# Patient Record
Sex: Male | Born: 1975
Health system: Southern US, Community
[De-identification: ages and names within clinical notes are randomized; demographics above are authoritative.]

## PROBLEM LIST (undated history)

## (undated) DIAGNOSIS — M23205 Derangement of unspecified medial meniscus due to old tear or injury, unspecified knee: Secondary | ICD-10-CM

## (undated) DIAGNOSIS — R0789 Other chest pain: Secondary | ICD-10-CM

## (undated) DIAGNOSIS — R079 Chest pain, unspecified: Secondary | ICD-10-CM

## (undated) DIAGNOSIS — Z9889 Other specified postprocedural states: Secondary | ICD-10-CM

## (undated) DIAGNOSIS — Q245 Malformation of coronary vessels: Secondary | ICD-10-CM

## (undated) DIAGNOSIS — R739 Hyperglycemia, unspecified: Secondary | ICD-10-CM

## (undated) DIAGNOSIS — R072 Precordial pain: Secondary | ICD-10-CM

## (undated) DIAGNOSIS — E876 Hypokalemia: Secondary | ICD-10-CM

## (undated) DIAGNOSIS — I10 Essential (primary) hypertension: Secondary | ICD-10-CM

## (undated) HISTORY — DX: Hypokalemia: E87.6

## (undated) HISTORY — DX: Precordial pain: R07.2

## (undated) HISTORY — DX: Other specified postprocedural states: Z98.890

## (undated) HISTORY — DX: Chest pain, unspecified: R07.9

## (undated) HISTORY — DX: Derangement of unspecified medial meniscus due to old tear or injury, unspecified knee: M23.205

## (undated) HISTORY — DX: Hyperglycemia, unspecified: R73.9

## (undated) HISTORY — DX: Malformation of coronary vessels: Q24.5

## (undated) HISTORY — DX: Other chest pain: R07.89

## (undated) HISTORY — PX: OTHER SURGICAL HISTORY: SHX169

---

## 2001-08-27 ENCOUNTER — Ambulatory Visit (HOSPITAL_COMMUNITY): Admission: RE | Admit: 2001-08-27 | Discharge: 2001-08-27 | Payer: Self-pay | Admitting: Family Medicine

## 2001-08-27 ENCOUNTER — Encounter: Payer: Self-pay | Admitting: Family Medicine

## 2003-10-17 ENCOUNTER — Ambulatory Visit (HOSPITAL_COMMUNITY): Admission: RE | Admit: 2003-10-17 | Discharge: 2003-10-17 | Payer: Self-pay | Admitting: Family Medicine

## 2011-07-22 ENCOUNTER — Emergency Department (HOSPITAL_COMMUNITY)
Admission: EM | Admit: 2011-07-22 | Discharge: 2011-07-23 | Disposition: A | Discharge status: Home / Self Care | Payer: Self-pay | Attending: Emergency Medicine | Admitting: Emergency Medicine

## 2011-07-22 ENCOUNTER — Encounter (HOSPITAL_COMMUNITY): Payer: Self-pay | Admitting: *Deleted

## 2011-07-22 DIAGNOSIS — M79609 Pain in unspecified limb: Secondary | ICD-10-CM | POA: Insufficient documentation

## 2011-07-22 DIAGNOSIS — Y92009 Unspecified place in unspecified non-institutional (private) residence as the place of occurrence of the external cause: Secondary | ICD-10-CM | POA: Insufficient documentation

## 2011-07-22 DIAGNOSIS — Y9383 Activity, rough housing and horseplay: Secondary | ICD-10-CM | POA: Insufficient documentation

## 2011-07-22 DIAGNOSIS — S9030XA Contusion of unspecified foot, initial encounter: Secondary | ICD-10-CM | POA: Insufficient documentation

## 2011-07-22 DIAGNOSIS — F17200 Nicotine dependence, unspecified, uncomplicated: Secondary | ICD-10-CM | POA: Insufficient documentation

## 2011-07-22 DIAGNOSIS — IMO0002 Reserved for concepts with insufficient information to code with codable children: Secondary | ICD-10-CM | POA: Insufficient documentation

## 2011-07-22 NOTE — ED Notes (Signed)
The pt has rt foot pain.  His girlfriend lay on her foot and he now has pain

## 2011-07-23 ENCOUNTER — Emergency Department (HOSPITAL_COMMUNITY): Payer: Self-pay

## 2011-07-23 MED ORDER — HYDROCODONE-ACETAMINOPHEN 5-325 MG PO TABS: 2.0000 | ORAL_TABLET | ORAL | Status: AC | PRN

## 2011-07-23 NOTE — ED Provider Notes (Signed)
History     CSN: [id number]  Arrival date & time 07/22/11  2346   First MD Initiated Contact with Patient 07/23/11 0017      Chief Complaint  Patient presents with  . Foot Injury    (Consider location/radiation/quality/duration/timing/severity/associated sxs/prior treatment) HPI Comments: Patient here after wrestling with his girlfriend and she landed on his right foot - reports pain worse with weight bearing but he is able to walk, denies ankle pain, has abrasion to the dorsum of right foot.    Patient is a 36 y.o. male presenting with foot injury. The history is provided by the patient and the spouse. No language interpreter was used.  Foot Injury  The incident occurred 1 to 2 hours ago. The incident occurred at home. The injury mechanism was compression and a direct blow. The pain is present in the right foot. The quality of the pain is described as aching. The pain is at a severity of 6/10. The pain is moderate. The pain has been constant since onset. Pertinent negatives include no numbness, no inability to bear weight, no loss of motion, no muscle weakness, no loss of sensation and no tingling. He reports no foreign bodies present. The symptoms are aggravated by bearing weight. He has tried nothing for the symptoms. The treatment provided no relief.    History reviewed. No pertinent past medical history.  History reviewed. No pertinent past surgical history.  No family history on file.  History  Substance Use Topics  . Smoking status: Current Everyday Smoker  . Smokeless tobacco: Not on file  . Alcohol Use:       Review of Systems  Constitutional: Negative for fever and chills.  HENT: Negative for neck pain.   Eyes: Negative for pain.  Respiratory: Negative for chest tightness and shortness of breath.   Cardiovascular: Negative for leg swelling.  Gastrointestinal: Negative for abdominal pain.  Genitourinary: Negative for flank pain.  Musculoskeletal: Positive for  myalgias and arthralgias. Negative for back pain and joint swelling.  Skin: Negative for rash.  Neurological: Negative for tingling, numbness and headaches.  Psychiatric/Behavioral: Negative for agitation.  All other systems reviewed and are negative.    Allergies  Review of patient's allergies indicates no known allergies.  Home Medications  No current outpatient prescriptions on file.  BP 121/75  Pulse 66  Temp(Src) 98.3 F (36.8 C) (Oral)  Resp 16  SpO2 97%  Physical Exam  Nursing note and vitals reviewed. Constitutional: He is oriented to person, place, and time. He appears well-developed and well-nourished. No distress.  HENT:  Head: Normocephalic and atraumatic.  Right Ear: External ear normal.  Left Ear: External ear normal.  Nose: Nose normal.  Mouth/Throat: Oropharynx is clear and moist. No oropharyngeal exudate.  Eyes: Conjunctivae are normal. Pupils are equal, round, and reactive to light. No scleral icterus.  Neck: Normal range of motion. Neck supple.  Cardiovascular: Normal rate, regular rhythm and normal heart sounds.  Exam reveals no gallop and no friction rub.   No murmur heard. Pulmonary/Chest: Effort normal and breath sounds normal. He exhibits no tenderness.  Abdominal: Soft. Bowel sounds are normal. There is no tenderness.  Musculoskeletal:       Right foot: He exhibits tenderness and swelling. He exhibits normal range of motion, normal capillary refill, no crepitus and no deformity.       Feet:  Neurological: He is alert and oriented to person, place, and time. No cranial nerve deficit.  Skin: Skin is warm and  dry. No rash noted. No erythema. No pallor.  Psychiatric: He has a normal mood and affect. His behavior is normal. Judgment and thought content normal.    ED Course  Procedures (including critical care time)  Labs Reviewed - No data to display Dg Foot Complete Right  07/23/2011  *RADIOLOGY REPORT*  Clinical Data: Blunt trauma  RIGHT FOOT  COMPLETE - 3+ VIEW  Comparison: None.  Findings: Negative for fracture, dislocation, or other acute abnormality.  Normal alignment and mineralization. No significant degenerative change.  Regional soft tissues unremarkable.  IMPRESSION:  Negative  Original Report Authenticated By: Osa Craver, M.D.     Right foot contusion    MDM  X-ray reveals right foot contusion without fracture noted - placed in post op shoe per ortho tech, will give short course of pain medication and recommend follow up with ortho.        Izola Price [place], [place] 07/23/11 [phone]

## 2011-07-23 NOTE — ED Provider Notes (Signed)
Medical screening examination/treatment/procedure(s) were performed by non-physician practitioner and as supervising physician I was immediately available for consultation/collaboration.  Lovada Barwick, MD 07/23/11 0826 

## 2011-07-23 NOTE — ED Notes (Signed)
Patient transported to X-ray 

## 2011-07-23 NOTE — ED Notes (Signed)
Patient is AOx4 and comfortable with his discharge instructions. 

## 2011-07-23 NOTE — ED Notes (Signed)
Paged ortho for a post op boot.

## 2011-09-06 ENCOUNTER — Emergency Department (INDEPENDENT_AMBULATORY_CARE_PROVIDER_SITE_OTHER)
Admission: EM | Admit: 2011-09-06 | Discharge: 2011-09-06 | Disposition: A | Discharge status: Home / Self Care | Payer: Self-pay | Source: Home / Self Care

## 2011-09-06 ENCOUNTER — Encounter (HOSPITAL_COMMUNITY): Payer: Self-pay | Admitting: Emergency Medicine

## 2011-09-06 ENCOUNTER — Other Ambulatory Visit: Payer: Self-pay

## 2011-09-06 DIAGNOSIS — J309 Allergic rhinitis, unspecified: Secondary | ICD-10-CM

## 2011-09-06 DIAGNOSIS — K529 Noninfective gastroenteritis and colitis, unspecified: Secondary | ICD-10-CM

## 2011-09-06 DIAGNOSIS — K5289 Other specified noninfective gastroenteritis and colitis: Secondary | ICD-10-CM

## 2011-09-06 MED ORDER — ONDANSETRON 4 MG PO TBDP
ORAL_TABLET | ORAL | Status: AC
  Filled 2011-09-06: qty 2

## 2011-09-06 MED ORDER — ACETAMINOPHEN 325 MG PO TABS
650.0000 mg | ORAL_TABLET | Freq: Once | ORAL | Status: AC
  Administered 2011-09-06: 650 mg via ORAL

## 2011-09-06 MED ORDER — ACETAMINOPHEN 325 MG PO TABS
ORAL_TABLET | ORAL | Status: AC
  Filled 2011-09-06: qty 2

## 2011-09-06 MED ORDER — ONDANSETRON 4 MG PO TBDP
8.0000 mg | ORAL_TABLET | Freq: Once | ORAL | Status: AC
  Administered 2011-09-06: 8 mg via ORAL

## 2011-09-06 NOTE — ED Provider Notes (Signed)
History     CSN: [id number]  Arrival date & time 09/06/11  1532   None     No chief complaint on file.   (Consider location/radiation/quality/duration/timing/severity/associated sxs/prior treatment) HPI Comments: Hispanic male accompanied by his girlfriend. Pt speaks english, but girlfriend also translates. Onset of nausea and diarrhea this morning. Stomach pain that that radiates up into his chest causing chest pressure. His girlfriend states that he was sweaty in his hands were clammy earlier this morning. He has been unable to eat or drink due to the nausea but he has not vomited. He has had 3 watery stools without blood. Patient denies recent antibiotics or travel. He states a coworker was sick with similar symptoms within the last 2 weeks. He also complains of nasal congestion and sneezing for the last one month. He has been taking loratadine or cetirizine as needed. He reports a history of seasonal allergies which usually flare this time of year. No cough or dyspnea.    History reviewed. No pertinent past medical history.  History reviewed. No pertinent past surgical history.  History reviewed. No pertinent family history.  History  Substance Use Topics  . Smoking status: Former Smoker -- 1.0 packs/day for 10 years    Types: Cigarettes    Quit date: 06/19/2011  . Smokeless tobacco: Never Used  . Alcohol Use: 3.0 oz/week    5 Cans of beer per week      Review of Systems  Constitutional: Positive for chills and fatigue. Negative for fever.  HENT: Positive for congestion, rhinorrhea and sneezing. Negative for ear pain, sore throat and sinus pressure.   Respiratory: Negative for cough, shortness of breath and wheezing.   Gastrointestinal: Positive for nausea, vomiting, abdominal pain and diarrhea.  Musculoskeletal: Positive for myalgias.    Allergies  Bee and Seasonal  Home Medications   Current Outpatient Rx  Name Route Sig Dispense Refill  . EPINEPHRINE 0.15  MG/0.3ML IJ DEVI Intramuscular Inject 0.15 mg into the muscle as needed.      BP 163/92  Pulse 84  Temp(Src) 98.1 F (36.7 C) (Oral)  Resp 16  SpO2 100%  Physical Exam  Nursing note and vitals reviewed. Constitutional: He appears well-developed and well-nourished. No distress.  HENT:  Head: Normocephalic and atraumatic.  Right Ear: Tympanic membrane, external ear and ear canal normal.  Left Ear: Tympanic membrane, external ear and ear canal normal.  Nose: Mucosal edema (bilat nasal turbs moderately swollen and pale) present.  Mouth/Throat: Uvula is midline, oropharynx is clear and moist and mucous membranes are normal. No oropharyngeal exudate, posterior oropharyngeal edema or posterior oropharyngeal erythema.  Neck: Neck supple.  Cardiovascular: Normal rate, regular rhythm and normal heart sounds.   Pulmonary/Chest: Effort normal and breath sounds normal. No respiratory distress.  Abdominal: Soft. Bowel sounds are normal. He exhibits no distension and no mass. There is no tenderness.  Lymphadenopathy:    He has no cervical adenopathy.  Neurological: He is alert.  Skin: Skin is warm and dry.  Psychiatric: He has a normal mood and affect.    ED Course  Procedures (including critical care time)  Labs Reviewed - No data to display No results found.   1. Gastroenteritis   2. Allergic rhinitis       MDM  EKG NSR, rate 82.  Nausea improved with Zofran. Pt tolerated po fluid challenge.         Melody [place], [place] 09/06/11 [phone]

## 2011-09-06 NOTE — ED Notes (Addendum)
Pt stated that he had diarrhea since this morning 3 times. His last two BMs was within the last hour. Pt's girlfriend says his diarrhea is brown and watery with some yellow. She works with [employer] and states that the smell of his diarrhea is suspicious of C.Diff. He's been nauseated, but hasn't vomited. He wanted to vomit, but he's been fighting it. He's been having issues with his sinuses since the last month. Runny nose and sneezing. His mucus is green and with some blood. He has chest pain that feels like there's pressure.

## 2011-09-06 NOTE — Discharge Instructions (Signed)
Tylenol or Ibuprofen as needed for discomfort or fever. Clear fluids. It is important to stay well hydrated. As symptoms begin to improve eat a light bland diet, then advance diet as tolerated. Start with rice, bananas, applesauce, plain toast, crackers, chicken noodle soup etc. Continue Claritin or Zyrtec for allergies. Return if symptoms change or worsen.

## 2011-09-06 NOTE — ED Provider Notes (Signed)
Medical screening examination/treatment/procedure(s) were performed by non-physician practitioner and as supervising physician I was immediately available for consultation/collaboration.  LANEY,RONNIE   Olanna Percifield B Laney, MD 09/06/11 2140 

## 2013-03-14 ENCOUNTER — Emergency Department (HOSPITAL_COMMUNITY)
Admission: EM | Admit: 2013-03-14 | Discharge: 2013-03-14 | Disposition: A | Discharge status: Home / Self Care | Payer: Self-pay | Attending: Emergency Medicine | Admitting: Emergency Medicine

## 2013-03-14 ENCOUNTER — Emergency Department (HOSPITAL_COMMUNITY): Payer: Self-pay

## 2013-03-14 ENCOUNTER — Encounter (HOSPITAL_COMMUNITY): Payer: Self-pay | Admitting: *Deleted

## 2013-03-14 DIAGNOSIS — S99919A Unspecified injury of unspecified ankle, initial encounter: Secondary | ICD-10-CM | POA: Insufficient documentation

## 2013-03-14 DIAGNOSIS — Y9389 Activity, other specified: Secondary | ICD-10-CM | POA: Insufficient documentation

## 2013-03-14 DIAGNOSIS — S8992XA Unspecified injury of left lower leg, initial encounter: Secondary | ICD-10-CM

## 2013-03-14 DIAGNOSIS — X500XXA Overexertion from strenuous movement or load, initial encounter: Secondary | ICD-10-CM | POA: Insufficient documentation

## 2013-03-14 DIAGNOSIS — Y929 Unspecified place or not applicable: Secondary | ICD-10-CM | POA: Insufficient documentation

## 2013-03-14 DIAGNOSIS — I10 Essential (primary) hypertension: Secondary | ICD-10-CM | POA: Insufficient documentation

## 2013-03-14 DIAGNOSIS — S8990XA Unspecified injury of unspecified lower leg, initial encounter: Secondary | ICD-10-CM | POA: Insufficient documentation

## 2013-03-14 DIAGNOSIS — Z87891 Personal history of nicotine dependence: Secondary | ICD-10-CM | POA: Insufficient documentation

## 2013-03-14 MED ORDER — IBUPROFEN 800 MG PO TABS: 800.0000 mg | ORAL_TABLET | Freq: Three times a day (TID) | ORAL | Status: DC

## 2013-03-14 MED ORDER — OXYCODONE-ACETAMINOPHEN 5-325 MG PO TABS
2.0000 | ORAL_TABLET | Freq: Once | ORAL | Status: AC
  Administered 2013-03-14: 2 via ORAL
  Filled 2013-03-14: qty 2

## 2013-03-14 MED ORDER — HYDROCODONE-ACETAMINOPHEN 5-325 MG PO TABS: 1.0000 | ORAL_TABLET | Freq: Four times a day (QID) | ORAL | Status: DC | PRN

## 2013-03-14 NOTE — ED Notes (Signed)
Pt with previous injury to L knee.  Was throwing darts last night and feels he twisted his L knee.  No bruising or obvious swelling noted at this time.  Pt unable to bear weight.

## 2013-03-14 NOTE — ED Provider Notes (Signed)
CSN: [id number]     Arrival date & time 03/14/13  1203 History  This chart was scribed for non-physician practitioner working with Flint Melter, MD by Shari Heritage, ED Scribe. This patient was seen in room TR05C/TR05C and the patient's care was started at 1:06 PM.    Chief Complaint  Patient presents with  . Knee Pain    The history is provided by the patient. No language interpreter was used.    HPI Comments: Kevin Navarro is a 37 y.o. male who presents to the Emergency Department complaining of a left knee injury that occurred last night. He states that he popped his knee when he twisted with his foot planted while throwing a dart. He says that a few hours after the injury, swelling developed. He states that he has a history of knee pain secondary to a fall that occurred 2 years ago and another injury while playing soccer. He was never treated either time. Patient reports that he has experienced sliding in the knee. He states that when he has increased activity, he sometimes experiences swelling in the knee. Patient denies any other injuries or symtpoms at this time. He has a medical history of HTN. He is a former smoker.   Past Medical History  Diagnosis Date  . Hypertension    History reviewed. No pertinent past surgical history. No family history on file. History  Substance Use Topics  . Smoking status: Former Games developer  . Smokeless tobacco: Not on file  . Alcohol Use: 7.2 oz/week    12 Cans of beer per week    Review of Systems A complete 10 system review of systems was obtained and all systems are negative except as noted in the HPI and PMH.   Allergies  Review of patient's allergies indicates no known allergies.  Home Medications  No current outpatient prescriptions on file.  Triage Vitals: BP 137/84  Pulse 77  Temp(Src) 98.1 F (36.7 C)  Resp 18  SpO2 99% Physical Exam  Nursing note and vitals reviewed. Constitutional: He is oriented to person, place, and  time. He appears well-developed and well-nourished. No distress.  HENT:  Head: Normocephalic and atraumatic.  Eyes: EOM are normal.  Neck: Neck supple. No tracheal deviation present.  Cardiovascular: Normal rate.   Pulmonary/Chest: Effort normal. No respiratory distress.  Musculoskeletal:  Pain at the insertion point of the adductors in left knee. Positive McMurray's test on the left. Negative anterior drawer test on the left.  Neurological: He is alert and oriented to person, place, and time.  Skin: Skin is warm and dry.  Psychiatric: He has a normal mood and affect. His behavior is normal.    ED Course  Procedures (including critical care time) DIAGNOSTIC STUDIES: Oxygen Saturation is 99% on room air, normal by my interpretation.    COORDINATION OF CARE: 1:14 PM- Will order 2 tablets of Percocet. Patient informed of current plan for treatment and evaluation and agrees with plan at this time.   Imaging Review Dg Knee Complete 4 Views Left  03/14/2013   CLINICAL DATA:  Left knee pain, sports injury 3 years ago with worsening pain since that time, twist injury playing soccer last night  EXAM: LEFT KNEE - COMPLETE 4+ VIEW  COMPARISON:  None  FINDINGS: Osseous mineralization normal.  Joint spaces preserved.  No acute fracture, dislocation or bone destruction.  Bony irregularity identified at the medial margin of medial femoral condyle superiorly lung question prior MCL injury.  Question knee joint  effusion.  IMPRESSION: Question sequela of prior MCL injury.  Question knee joint effusion without definite acute osseous findings.   Electronically Signed   By: Ulyses Southward M.D.   On: 03/14/2013 13:52    MDM   1. Knee injury, left, initial encounter     Filed Vitals:   03/14/13 1207  BP: 137/84  Pulse: 77  Temp: 98.1 F (36.7 C)  Resp: 18  SpO2: 99%    Patient xray negative for acute abnormality. Will d.c with knee immobilizer/ crutches. Follow up with ortho. Pain meds and  NSAIDS. RICE. Return precautions discussed  I personally performed the services described in this documentation, which was scribed in my presence. The recorded information has been reviewed and is accurate.     Arthor Captain, PA-C 03/14/13 [id number]

## 2013-03-15 NOTE — ED Provider Notes (Signed)
Medical screening examination/treatment/procedure(s) were performed by non-physician practitioner and as supervising physician I was immediately available for consultation/collaboration.  Daleen Steinhaus L Rushi Chasen, MD 03/15/13 1553 

## 2013-03-17 ENCOUNTER — Other Ambulatory Visit (HOSPITAL_COMMUNITY): Payer: Self-pay | Admitting: Orthopaedic Surgery

## 2013-03-17 DIAGNOSIS — M25562 Pain in left knee: Secondary | ICD-10-CM

## 2013-03-23 ENCOUNTER — Ambulatory Visit (HOSPITAL_COMMUNITY)
Admission: RE | Admit: 2013-03-23 | Discharge: 2013-03-23 | Disposition: A | Discharge status: Home / Self Care | Payer: Self-pay | Source: Ambulatory Visit | Attending: Orthopaedic Surgery | Admitting: Orthopaedic Surgery

## 2013-03-23 DIAGNOSIS — S838X9A Sprain of other specified parts of unspecified knee, initial encounter: Secondary | ICD-10-CM | POA: Insufficient documentation

## 2013-03-23 DIAGNOSIS — IMO0002 Reserved for concepts with insufficient information to code with codable children: Secondary | ICD-10-CM | POA: Insufficient documentation

## 2013-03-23 DIAGNOSIS — X58XXXA Exposure to other specified factors, initial encounter: Secondary | ICD-10-CM | POA: Insufficient documentation

## 2013-03-23 DIAGNOSIS — M25562 Pain in left knee: Secondary | ICD-10-CM

## 2013-03-23 DIAGNOSIS — M25469 Effusion, unspecified knee: Secondary | ICD-10-CM | POA: Insufficient documentation

## 2015-08-25 ENCOUNTER — Emergency Department (HOSPITAL_COMMUNITY)
Admission: EM | Admit: 2015-08-25 | Discharge: 2015-08-25 | Disposition: A | Discharge status: Home / Self Care | Payer: Self-pay | Attending: Emergency Medicine | Admitting: Emergency Medicine

## 2015-08-25 DIAGNOSIS — R112 Nausea with vomiting, unspecified: Secondary | ICD-10-CM

## 2015-08-25 DIAGNOSIS — J069 Acute upper respiratory infection, unspecified: Secondary | ICD-10-CM | POA: Insufficient documentation

## 2015-08-25 DIAGNOSIS — R197 Diarrhea, unspecified: Secondary | ICD-10-CM

## 2015-08-25 DIAGNOSIS — B349 Viral infection, unspecified: Secondary | ICD-10-CM | POA: Insufficient documentation

## 2015-08-25 DIAGNOSIS — Z87891 Personal history of nicotine dependence: Secondary | ICD-10-CM | POA: Insufficient documentation

## 2015-08-25 LAB — CBC
HCT: 44.9 % (ref 39.0–52.0)
Hemoglobin: 15 g/dL (ref 13.0–17.0)
MCH: 29.2 pg (ref 26.0–34.0)
MCHC: 33.4 g/dL (ref 30.0–36.0)
MCV: 87.4 fL (ref 78.0–100.0)
PLATELETS: 176 10*3/uL (ref 150–400)
RBC: 5.14 MIL/uL (ref 4.22–5.81)
RDW: 12.4 % (ref 11.5–15.5)
WBC: 6.1 10*3/uL (ref 4.0–10.5)

## 2015-08-25 LAB — COMPREHENSIVE METABOLIC PANEL
ALBUMIN: 3.8 g/dL (ref 3.5–5.0)
ALK PHOS: 65 U/L (ref 38–126)
ALT: 38 U/L (ref 17–63)
AST: 33 U/L (ref 15–41)
Anion gap: 10 (ref 5–15)
BUN: 10 mg/dL (ref 6–20)
CALCIUM: 9.3 mg/dL (ref 8.9–10.3)
CHLORIDE: 105 mmol/L (ref 101–111)
CO2: 26 mmol/L (ref 22–32)
CREATININE: 0.9 mg/dL (ref 0.61–1.24)
GFR calc non Af Amer: >60 mL/min (ref >60)
GLUCOSE: 153 mg/dL — AB (ref 65–99)
Potassium: 4 mmol/L (ref 3.5–5.1)
SODIUM: 141 mmol/L (ref 135–145)
Total Bilirubin: 0.5 mg/dL (ref 0.3–1.2)
Total Protein: 7 g/dL (ref 6.5–8.1)

## 2015-08-25 LAB — RAPID STREP SCREEN (MED CTR MEBANE ONLY): Streptococcus, Group A Screen (Direct): NEGATIVE

## 2015-08-25 LAB — LIPASE, BLOOD: LIPASE: 21 U/L (ref 11–51)

## 2015-08-25 MED ORDER — BENZONATATE 100 MG PO CAPS: 100.0000 mg | ORAL_CAPSULE | Freq: Three times a day (TID) | ORAL | Status: DC | PRN

## 2015-08-25 MED ORDER — ONDANSETRON 4 MG PO TBDP: 4.0000 mg | ORAL_TABLET | Freq: Three times a day (TID) | ORAL | Status: DC | PRN

## 2015-08-25 MED ORDER — NAPROXEN 250 MG PO TABS: 250.0000 mg | ORAL_TABLET | Freq: Two times a day (BID) | ORAL | Status: DC

## 2015-08-25 MED ORDER — ONDANSETRON 4 MG PO TBDP
4.0000 mg | ORAL_TABLET | Freq: Once | ORAL | Status: AC
  Administered 2015-08-25: 4 mg via ORAL
  Filled 2015-08-25: qty 1

## 2015-08-25 MED ORDER — IBUPROFEN 400 MG PO TABS
800.0000 mg | ORAL_TABLET | Freq: Once | ORAL | Status: AC
  Administered 2015-08-25: 800 mg via ORAL
  Filled 2015-08-25: qty 2

## 2015-08-25 MED ORDER — ONDANSETRON HCL 4 MG/2ML IJ SOLN
4.0000 mg | Freq: Once | INTRAMUSCULAR | Status: AC
  Administered 2015-08-25: 4 mg via INTRAVENOUS
  Filled 2015-08-25: qty 2

## 2015-08-25 MED ORDER — CETIRIZINE HCL 10 MG PO TABS: 10.0000 mg | ORAL_TABLET | Freq: Every day | ORAL | Status: DC

## 2015-08-25 MED ORDER — SODIUM CHLORIDE 0.9 % IV BOLUS (SEPSIS)
1000.0000 mL | Freq: Once | INTRAVENOUS | Status: AC
  Administered 2015-08-25: 1000 mL via INTRAVENOUS

## 2015-08-25 MED ORDER — ACETAMINOPHEN 325 MG PO TABS
650.0000 mg | ORAL_TABLET | Freq: Once | ORAL | Status: AC
  Administered 2015-08-25: 650 mg via ORAL
  Filled 2015-08-25: qty 2

## 2015-08-25 NOTE — ED Notes (Signed)
Pt tolerating sips with meds, ambulated outside ER without any issue.

## 2015-08-25 NOTE — ED Provider Notes (Signed)
CSN: [id number]     Arrival date & time 08/25/15  1704 History  By signing my name below, I, Soijett Blue, attest that this documentation has been prepared under the direction and in the presence of Will Jayda White, PA-C Electronically Signed: Soijett Blue, ED Scribe. 08/25/2015. 7:54 PM.   Chief Complaint  Patient presents with  . Fever      The history is provided by the patient. No language interpreter was used.    HPI Comments: Kevin Navarro is a 40 y.o. male who presents to the Emergency Department complaining of fever of 102-103 onset 3 days. He reports that his initial symptoms were a fever 4 nights ago. Pt states that he feels better as of now, but he came into the ED due to his fever. Pt notes that his fever has been 102-103 x 3 days that resolved this morning. Pt denies getting his flu shot this past year. He states that he is having associated symptoms of vomiting x 2 episodes today, diarrhea x 1 episodes today, non-productive cough x 4 days, nasal congestion, generalized body aches, and sore throat. He states that he has tried 800 mg ibuprofen with his last dose at 12 PM and gatarode, for the relief of his fever. He denies blood in stool, blood in vomit, abdominal pain, nausea, SOB, wheezing, lightheadedness, dizziness, LOC, rash, and any other symptoms. Denies abdominal surgeries. Denies any PMHx.    No past medical history on file. No past surgical history on file. No family history on file. Social History  Substance Use Topics  . Smoking status: Former Smoker -- 1.00 packs/day for 10 years    Types: Cigarettes    Quit date: 06/19/2011  . Smokeless tobacco: Never Used  . Alcohol Use: 3.0 oz/week    5 Cans of beer per week    Review of Systems  Constitutional: Positive for fever. Negative for chills.  HENT: Positive for congestion, rhinorrhea and sore throat. Negative for trouble swallowing.   Eyes: Negative for visual disturbance.  Respiratory: Positive for cough. Negative  for shortness of breath and wheezing.   Cardiovascular: Negative for chest pain.  Gastrointestinal: Positive for vomiting and diarrhea. Negative for nausea, abdominal pain and blood in stool.  Genitourinary: Negative for dysuria, urgency, frequency and hematuria.  Musculoskeletal: Positive for myalgias. Negative for back pain, neck pain and neck stiffness.  Skin: Negative for rash.  Neurological: Negative for dizziness, syncope, light-headedness and headaches.      Allergies  Cholestatin and Nutritional supplements  Home Medications   Prior to Admission medications   Medication Sig Start Date End Date Taking? Authorizing Provider  benzonatate (TESSALON) 100 MG capsule Take 1 capsule (100 mg total) by mouth 3 (three) times daily as needed for cough. 08/25/15   Everlene Farrier, PA-C  cetirizine (ZYRTEC ALLERGY) 10 MG tablet Take 1 tablet (10 mg total) by mouth daily. 08/25/15   Everlene Farrier, PA-C  EPINEPHrine (EPIPEN JR) 0.15 MG/0.3ML injection Inject 0.15 mg into the muscle as needed.    Historical Provider, MD  naproxen (NAPROSYN) 250 MG tablet Take 1 tablet (250 mg total) by mouth 2 (two) times daily with a meal. 08/25/15   Everlene Farrier, PA-C  ondansetron (ZOFRAN ODT) 4 MG disintegrating tablet Take 1 tablet (4 mg total) by mouth every 8 (eight) hours as needed for nausea or vomiting. 08/25/15   Everlene Farrier, PA-C   BP 131/76 mmHg  Pulse 60  Temp(Src) 98.3 F (36.8 C) (Oral)  Resp 16  Ht  5\' 6"  (1.676 m)  Wt 75.467 kg  BMI 26.87 kg/m2  SpO2 100% Physical Exam  Constitutional: He is oriented to person, place, and time. He appears well-developed and well-nourished. No distress.  Nontoxic-appearing.  HENT:  Head: Normocephalic and atraumatic.  Right Ear: External ear normal.  Left Ear: External ear normal.  Mouth/Throat: Uvula is midline and mucous membranes are normal. No trismus in the jaw. No uvula swelling. No oropharyngeal exudate.  Moderate bilateral tonsillar hypertrophy  without exudate. No trismus or drooling. Uvula midline without edema. Tongue protrusion nl. No peritonsillar abscess. Bilateral tympanic membranes are pearly-gray without erythema or loss of landmarks.   Eyes: Conjunctivae are normal. Pupils are equal, round, and reactive to light. Right eye exhibits no discharge. Left eye exhibits no discharge.  Neck: Neck supple.  Cardiovascular: Normal rate, regular rhythm, normal heart sounds and intact distal pulses.   Pulmonary/Chest: Effort normal and breath sounds normal. No respiratory distress. He has no wheezes. He has no rales.  Lungs clear to ausculation bilaterally. No wheezes or rhonchi.  Abdominal: Soft. Bowel sounds are normal. He exhibits no distension. There is no tenderness. There is no rebound and no guarding.  Abdomen is soft and nontender to palpation. Bowel sounds are present. No peritoneal signs.  Musculoskeletal: He exhibits no edema or tenderness.  Lymphadenopathy:    He has no cervical adenopathy.  Neurological: He is alert and oriented to person, place, and time. Coordination normal.  Skin: Skin is warm and dry. No rash noted. He is not diaphoretic. No erythema. No pallor.  Psychiatric: He has a normal mood and affect. His behavior is normal.  Nursing note and vitals reviewed.   ED Course  Procedures (including critical care time) DIAGNOSTIC STUDIES: Oxygen Saturation is 96% on RA, nl by my interpretation.    COORDINATION OF CARE: 7:41 PM Discussed treatment plan with pt at bedside which includes labs and rapid strep screen and culture, and pt agreed to plan.     Labs Review Labs Reviewed  COMPREHENSIVE METABOLIC PANEL - Abnormal; Notable for the following:    Glucose, Bld 153 (*)    All other components within normal limits  RAPID STREP SCREEN (NOT AT [facility])  CULTURE, GROUP A STREP ([facility])  LIPASE, BLOOD  CBC    Imaging Review No results found. I have personally reviewed and evaluated these lab results as part of  my medical decision-making.   EKG Interpretation None      Filed Vitals:   08/25/15 1719 08/25/15 2153  BP: 124/88 131/76  Pulse: 77 60  Temp: 98.9 F (37.2 C) 98.3 F (36.8 C)  TempSrc: Oral Oral  Resp: 18 16  Height: 5\' 6"  (1.676 m)   Weight: 75.467 kg   SpO2: 96% 100%      MDM   Meds given in ED:  Medications  sodium chloride 0.9 % bolus 1,000 mL (0 mLs Intravenous Stopped 08/25/15 2058)  ondansetron (ZOFRAN) injection 4 mg (4 mg Intravenous Given 08/25/15 1956)  acetaminophen (TYLENOL) tablet 650 mg (650 mg Oral Given 08/25/15 2058)  ondansetron (ZOFRAN-ODT) disintegrating tablet 4 mg (4 mg Oral Given 08/25/15 2214)  ibuprofen (ADVIL,MOTRIN) tablet 800 mg (800 mg Oral Given 08/25/15 2214)    Discharge Medication List as of 08/25/2015  9:48 PM    START taking these medications   Details  benzonatate (TESSALON) 100 MG capsule Take 1 capsule (100 mg total) by mouth 3 (three) times daily as needed for cough., Starting 08/25/2015, Until Discontinued, Print  cetirizine (ZYRTEC ALLERGY) 10 MG tablet Take 1 tablet (10 mg total) by mouth daily., Starting 08/25/2015, Until Discontinued, Print    naproxen (NAPROSYN) 250 MG tablet Take 1 tablet (250 mg total) by mouth 2 (two) times daily with a meal., Starting 08/25/2015, Until Discontinued, Print    ondansetron (ZOFRAN ODT) 4 MG disintegrating tablet Take 1 tablet (4 mg total) by mouth every 8 (eight) hours as needed for nausea or vomiting., Starting 08/25/2015, Until Discontinued, Print        Final diagnoses:  Viral syndrome  Nausea vomiting and diarrhea  URI (upper respiratory infection)   This is a 40 y.o. male who presents to the Emergency Department complaining of fever of 102-103 onset 3 days. He reports that his initial symptoms were a fever 4 nights ago. Pt states that he feels better as of now, but he came into the ED due to his fever. Pt notes that his fever has been 102-103 x 3 days that resolved this morning. Pt denies getting  his flu shot this past year. He states that he is having associated symptoms of vomiting x 2 episodes today, diarrhea x 1 episodes today, non-productive cough x 4 days, nasal congestion, generalized body aches, and sore throat. On exam the patient is afebrile and nontoxic appearing. His mild bilateral tonsillar hypertrophy without exudates. His abdomen is soft and nontender to palpation. His lungs are clear to auscultation bilaterally. No wheezes. We'll check a rapid strep test. Lipase is within normal limits. CMP is remarkable only for glucose of 153. CBC is within normal limits. No leukocytosis. Will provide with fluid bolus, and Tylenol. Rapid strep test is negative.  At reevaluation the patient reports he is feeling better. He is tolerating water without vomiting. He has had no vomiting in the ED.  He is also tolerated Tylenol without vomiting. He is afebrile. He reports feeling ready for discharge. I discussed that the patient has a viral syndrome and will discharge with prescriptions for Tessalon Perles, Zyrtec, naproxen and Zofran. I discussed return precautions. I advised the patient to follow-up with their primary care provider this week. I advised the patient to return to the emergency department with new or worsening symptoms or new concerns. The patient verbalized understanding and agreement with plan.    After the patient was discharged I was called back to bedside. The patient reports feeling nauseated. I provided him with an emesis basin and advised I would provide him with more Zofran. I was then called to bedside again and the patient has vomited up water that he previously drank. I advised we would keep him here until he feels better again and can tolerate water before we would discharge. The patient agrees to this plan. Friend at bedside became upset. She states that fluids and medication will not help him and he needs to stop vomiting. I explained that I agreed with her and I would like  the patient to not be vomiting prior discharge. She is still upset and I asked her what she would like me to do to help him further or if there was something that I could do for her. She was still very upset and wants to leave. Patient is taking zofran at this time. I explained that I could not stop them from leaving, but I would like to make sure the patient is tolerating PO prior to discharge. I left to see another patient. When I returned they had left the ED.   I personally performed the  services described in this documentation, which was scribed in my presence. The recorded information has been reviewed and is accurate.        Everlene Farrier, PA-C 08/25/15 2255  Pricilla Loveless, MD 08/31/15 [phone]

## 2015-08-25 NOTE — ED Notes (Signed)
Pt vomited once, given zofran. Pt family member upset pt is being discharged due to pt vomiting. Pt instructed by PA that this can be normal and is prescribed zofran to go home.

## 2015-08-25 NOTE — ED Notes (Signed)
Pt in from home c/o fever onset x 4 days with vomiting & diarrhea onset today, x 2 vomiting episodes & x1 liquid stools, pt c/o non productive cough x 4 days, pt reports relief of fever with Ibuprofen 800mg , A&O x4, denies CP & SOB

## 2015-08-25 NOTE — Discharge Instructions (Signed)
Viral Gastroenteritis Viral gastroenteritis is also known as stomach flu. This condition affects the stomach and intestinal tract. It can cause sudden diarrhea and vomiting. The illness typically lasts 3 to 8 days. Most people develop an immune response that eventually gets rid of the virus. While this natural response develops, the virus can make you quite ill. CAUSES  Many different viruses can cause gastroenteritis, such as rotavirus or noroviruses. You can catch one of these viruses by consuming contaminated food or water. You may also catch a virus by sharing utensils or other personal items with an infected person or by touching a contaminated surface. SYMPTOMS  The most common symptoms are diarrhea and vomiting. These problems can cause a severe loss of body fluids (dehydration) and a body salt (electrolyte) imbalance. Other symptoms may include:  Fever.  Headache.  Fatigue.  Abdominal pain. DIAGNOSIS  Your caregiver can usually diagnose viral gastroenteritis based on your symptoms and a physical exam. A stool sample may also be taken to test for the presence of viruses or other infections. TREATMENT  This illness typically goes away on its own. Treatments are aimed at rehydration. The most serious cases of viral gastroenteritis involve vomiting so severely that you are not able to keep fluids down. In these cases, fluids must be given through an intravenous line (IV). HOME CARE INSTRUCTIONS   Drink enough fluids to keep your urine clear or pale yellow. Drink small amounts of fluids frequently and increase the amounts as tolerated.  Ask your caregiver for specific rehydration instructions.  Avoid:  Foods high in sugar.  Alcohol.  Carbonated drinks.  Tobacco.  Juice.  Caffeine drinks.  Extremely hot or cold fluids.  Fatty, greasy foods.  Too much intake of anything at one time.  Dairy products until 24 to 48 hours after diarrhea stops.  You may consume probiotics.  Probiotics are active cultures of beneficial bacteria. They may lessen the amount and number of diarrheal stools in adults. Probiotics can be found in yogurt with active cultures and in supplements.  Wash your hands well to avoid spreading the virus.  Only take over-the-counter or prescription medicines for pain, discomfort, or fever as directed by your caregiver. Do not give aspirin to children. Antidiarrheal medicines are not recommended.  Ask your caregiver if you should continue to take your regular prescribed and over-the-counter medicines.  Keep all follow-up appointments as directed by your caregiver. SEEK IMMEDIATE MEDICAL CARE IF:   You are unable to keep fluids down.  You do not urinate at least once every 6 to 8 hours.  You develop shortness of breath.  You notice blood in your stool or vomit. This may look like coffee grounds.  You have abdominal pain that increases or is concentrated in one small area (localized).  You have persistent vomiting or diarrhea.  You have a fever.  The patient is a child younger than 3 months, and he or she has a fever.  The patient is a child older than 3 months, and he or she has a fever and persistent symptoms.  The patient is a child older than 3 months, and he or she has a fever and symptoms suddenly get worse.  The patient is a baby, and he or she has no tears when crying. MAKE SURE YOU:   Understand these instructions.  Will watch your condition.  Will get help right away if you are not doing well or get worse.   This information is not intended to replace  advice given to you by your health care provider. Make sure you discuss any questions you have with your health care provider.   Document Released: 06/04/2005 Document Revised: 08/27/2011 Document Reviewed: 03/21/2011 Elsevier Interactive Patient Education 2016 [employer] Upper Respiratory Infection, Adult Most upper respiratory infections (URIs) are a viral infection  of the air passages leading to the lungs. A URI affects the nose, throat, and upper air passages. The most common type of URI is nasopharyngitis and is typically referred to as "the common cold." URIs run their course and usually go away on their own. Most of the time, a URI does not require medical attention, but sometimes a bacterial infection in the upper airways can follow a viral infection. This is called a secondary infection. Sinus and middle ear infections are common types of secondary upper respiratory infections. Bacterial pneumonia can also complicate a URI. A URI can worsen asthma and chronic obstructive pulmonary disease (COPD). Sometimes, these complications can require emergency medical care and may be life threatening.  CAUSES Almost all URIs are caused by viruses. A virus is a type of germ and can spread from one person to another.  RISKS FACTORS You may be at risk for a URI if:   You smoke.   You have chronic heart or lung disease.  You have a weakened defense (immune) system.   You are very young or very old.   You have nasal allergies or asthma.  You work in crowded or poorly ventilated areas.  You work in health care facilities or schools. SIGNS AND SYMPTOMS  Symptoms typically develop 2-3 days after you come in contact with a cold virus. Most viral URIs last 7-10 days. However, viral URIs from the influenza virus (flu virus) can last 14-18 days and are typically more severe. Symptoms may include:   Runny or stuffy (congested) nose.   Sneezing.   Cough.   Sore throat.   Headache.   Fatigue.   Fever.   Loss of appetite.   Pain in your forehead, behind your eyes, and over your cheekbones (sinus pain).  Muscle aches.  DIAGNOSIS  Your health care provider may diagnose a URI by:  Physical exam.  Tests to check that your symptoms are not due to another condition such as:  Strep throat.  Sinusitis.  Pneumonia.  Asthma. TREATMENT  A  URI goes away on its own with time. It cannot be cured with medicines, but medicines may be prescribed or recommended to relieve symptoms. Medicines may help:  Reduce your fever.  Reduce your cough.  Relieve nasal congestion. HOME CARE INSTRUCTIONS   Take medicines only as directed by your health care provider.   Gargle warm saltwater or take cough drops to comfort your throat as directed by your health care provider.  Use a warm mist humidifier or inhale steam from a shower to increase air moisture. This may make it easier to breathe.  Drink enough fluid to keep your urine clear or pale yellow.   Eat soups and other clear broths and maintain good nutrition.   Rest as needed.   Return to work when your temperature has returned to normal or as your health care provider advises. You may need to stay home longer to avoid infecting others. You can also use a face mask and careful hand washing to prevent spread of the virus.  Increase the usage of your inhaler if you have asthma.   Do not use any tobacco products, including cigarettes, chewing tobacco,  cigarettes, chewing tobacco, or electronic cigarettes. If you need help quitting, ask your health care provider. PREVENTION  The best way to protect yourself from getting a cold is to practice good hygiene.   Avoid oral or hand contact with people with cold symptoms.   Wash your hands often if contact occurs.  There is no clear evidence that vitamin C, vitamin E, echinacea, or exercise reduces the chance of developing a cold. However, it is always recommended to get plenty of rest, exercise, and practice good nutrition.  SEEK MEDICAL CARE IF:   You are getting worse rather than better.   Your symptoms are not controlled by medicine.   You have chills.  You have worsening shortness of breath.  You have brown or red mucus.  You have yellow or brown nasal discharge.  You have pain in your  face, especially when you bend forward.  You have a fever.  You have swollen neck glands.  You have pain while swallowing.  You have white areas in the back of your throat. SEEK IMMEDIATE MEDICAL CARE IF:   You have severe or persistent:  Headache.  Ear pain.  Sinus pain.  Chest pain.  You have chronic lung disease and any of the following:  Wheezing.  Prolonged cough.  Coughing up blood.  A change in your usual mucus.  You have a stiff neck.  You have changes in your:  Vision.  Hearing.  Thinking.  Mood. MAKE SURE YOU:   Understand these instructions.  Will watch your condition.  Will get help right away if you are not doing well or get worse.   This information is not intended to replace advice given to you by your health care provider. Make sure you discuss any questions you have with your health care provider.   Document Released: 11/28/2000 Document Revised: 10/19/2014 Document Reviewed: 09/09/2013 Elsevier Interactive Patient Education 2016 Elsevier Inc.  

## 2015-08-28 LAB — CULTURE, GROUP A STREP (THRC)

## 2016-06-07 ENCOUNTER — Ambulatory Visit (INDEPENDENT_AMBULATORY_CARE_PROVIDER_SITE_OTHER): Payer: Self-pay | Admitting: Orthopaedic Surgery

## 2016-06-07 DIAGNOSIS — M23204 Derangement of unspecified medial meniscus due to old tear or injury, left knee: Secondary | ICD-10-CM

## 2016-06-07 DIAGNOSIS — M25562 Pain in left knee: Secondary | ICD-10-CM

## 2016-06-07 MED ORDER — LIDOCAINE HCL 1 % IJ SOLN
3.0000 mL | INTRAMUSCULAR | Status: AC | PRN
  Administered 2016-06-07: 3 mL

## 2016-06-07 MED ORDER — METHYLPREDNISOLONE ACETATE 40 MG/ML IJ SUSP
40.0000 mg | INTRAMUSCULAR | Status: AC | PRN
  Administered 2016-06-07: 40 mg via INTRA_ARTICULAR

## 2016-06-07 NOTE — Progress Notes (Signed)
Office Visit Note   Patient: Kevin Navarro           Date of Birth: 09-Jul-1975           MRN: [id number] Visit Date: 06/07/2016              Requested by: No referring provider defined for this encounter. PCP: PROVIDER NOT IN SYSTEM   Assessment & Plan: Visit Diagnoses:  1. Acute pain of left knee   2. Old bucket handle tear of medial meniscus of left knee     Plan: At this point once again I'm recommending a left knee arthroscopy with debridement and partial medial meniscectomy. This is a chronic tear and is causing him such mechanical symptoms that surgery is certainly warranted. Hopefully the injection will help buy some time and I have no other recommendations other than arthroscopic intervention. I explained in detail with the risk and benefits of the surgery involving and this was a thorough discussion. We would see him back at one week postoperative for suture removal.  Follow-Up Instructions: Return for 1 week post-op.   Orders:  No orders of the defined types were placed in this encounter.  No orders of the defined types were placed in this encounter.     Procedures: Large Joint Inj Date/Time: 06/07/2016 3:38 PM Performed by: Kathryne HitchBLACKMAN, Sherran Margolis Y Authorized by: Kathryne HitchBLACKMAN, Kiki Bivens Y   Location:  Knee Site:  L knee Ultrasound Guidance: No   Fluoroscopic Guidance: No   Arthrogram: No   Medications:  3 mL lidocaine 1 %; 40 mg methylPREDNISolone acetate 40 MG/ML     Clinical Data: No additional findings.   Subjective: Chief Complaint  Patient presents with  . Left Knee - Pain    Patient recently on knees working/nailing molding to wall and felt sharp pain. He is wearing knee immobilizer and using crutches.    HPI The patient is well-known to me. He has a history of a large bucket-handle type tear of his left knee medial meniscus. We actually had him set up for surgery in 2015 but he then decided not to have surgery. His knee locked up on him  again yesterday when he was down the ground and is quite swollen the day. He is in a knee immobilizer and feel like at this point he needs to have surgery. His pain is 10 out of 10. It's affecting how he walks. Review of Systems Negative for chest pain, shortness of breath, headache, fever, chills, nausea, vomiting.  Objective: Vital Signs: There were no vitals taken for this visit.  Physical Exam He is alert and oriented 3. Ortho Exam Examination of his left knee does show a moderate effusion. I tried aspirating fluid from the knee and got about 10 mL of bloody fluid from the knee. He has a painful range of motion of the knee and is mainly the medial joint line. He has a significantly positive Murray sign to the medial side. I cannot fully extend the knee and having trouble fully flexing it as well. Specialty Comments:  No specialty comments available.  Imaging: No results found.   PMFS History: There are no active problems to display for this patient.  Past Medical History:  Diagnosis Date  . Hypertension     No family history on file.  No past surgical history on file. Social History   Occupational History  . Not on file.   Social History Main Topics  . Smoking status: Former Games developermoker  .  Smokeless tobacco: Not on file  . Alcohol use 7.2 oz/week    12 Cans of beer per week  . Drug use: No  . Sexual activity: Not on file

## 2016-06-13 ENCOUNTER — Other Ambulatory Visit (INDEPENDENT_AMBULATORY_CARE_PROVIDER_SITE_OTHER): Payer: Self-pay | Admitting: Orthopaedic Surgery

## 2016-06-13 DIAGNOSIS — M23204 Derangement of unspecified medial meniscus due to old tear or injury, left knee: Secondary | ICD-10-CM

## 2016-06-19 NOTE — Patient Instructions (Signed)
Kevin Navarro  06/19/2016   Your procedure is scheduled on: 06/22/2016  [facility]  Entrance [place]  elevators to 3rd floor to  Short Stay Center at   0915 AM.  Call this number if you have problems the morning of surgery [phone]   Remember: ONLY 1 PERSON MAY GO WITH YOU TO SHORT STAY TO GET  READY MORNING OF YOUR SURGERY.  Do not eat food or drink liquids :After Midnight.     Take these medicines the morning of surgery with A SIP OF WATER: NONE                                You may not have any metal on your body including hair pins and              piercings   lotions, powders or perfumes, deodorant                           Men may shave face and neck.   Do not bring valuables to the hospital. [facility] IS NOT             RESPONSIBLE   FOR VALUABLES.  Contacts, dentures or bridgework may not be worn into surgery.      Patients discharged the day of surgery will not be allowed to drive home.  Name and phone number of your driver:  Special Instructions: N/A              Please read over the following fact sheets you were given: _____________________________________________________________________             [facility] - Preparing for [facility] Before surgery, you can play an important role.  Because skin is not sterile, your skin needs to be as free of germs as possible.  You can reduce the number of germs on your skin by washing with CHG (chlorahexidine gluconate) soap before surgery.  CHG is an antiseptic cleaner which kills germs and bonds with the skin to continue killing germs even after washing. Please DO NOT use if you have an allergy to CHG or antibacterial soaps.  If your skin becomes reddened/irritated stop using the CHG and inform your nurse when you arrive at Short Stay. Do not shave (including legs and underarms) for at least 48 hours prior to the first CHG shower.  You may shave your face/neck. Please follow  these instructions carefully:  1.  Shower with CHG Soap the night before surgery and the  morning of Surgery.  2.  If you choose to wash your hair, wash your hair first as usual with your  normal  shampoo.  3.  After you shampoo, rinse your hair and body thoroughly to remove the  shampoo.                           4.  Use CHG as you would any other liquid soap.  You can apply chg directly  to the skin and wash                       Gently with a scrungie or clean washcloth.  5.  Apply the CHG Soap to your body ONLY FROM  THE NECK DOWN.   Do not use on face/ open                           Wound or open sores. Avoid contact with eyes, ears mouth and genitals (private parts).                       Wash face,  Genitals (private parts) with your normal soap.             6.  Wash thoroughly, paying special attention to the area where your surgery  will be performed.  7.  Thoroughly rinse your body with warm water from the neck down.  8.  DO NOT shower/wash with your normal soap after using and rinsing off  the CHG Soap.                9.  Pat yourself dry with a clean towel.            10.  Wear clean pajamas.            11.  Place clean sheets on your bed the night of your first shower and do not  sleep with pets. Day of Surgery : Do not apply any lotions/deodorants the morning of surgery.  Please wear clean clothes to the hospital/surgery center.  FAILURE TO FOLLOW THESE INSTRUCTIONS MAY RESULT IN THE CANCELLATION OF YOUR SURGERY PATIENT SIGNATURE_________________________________  NURSE SIGNATURE__________________________________  ________________________________________________________________________

## 2016-06-20 ENCOUNTER — Encounter (HOSPITAL_COMMUNITY): Payer: Self-pay

## 2016-06-20 ENCOUNTER — Encounter (HOSPITAL_COMMUNITY)
Admission: RE | Admit: 2016-06-20 | Discharge: 2016-06-20 | Disposition: A | Discharge status: Home / Self Care | Payer: Self-pay | Source: Ambulatory Visit | Attending: Orthopaedic Surgery | Admitting: Orthopaedic Surgery

## 2016-06-20 DIAGNOSIS — X58XXXA Exposure to other specified factors, initial encounter: Secondary | ICD-10-CM | POA: Insufficient documentation

## 2016-06-20 DIAGNOSIS — S83242A Other tear of medial meniscus, current injury, left knee, initial encounter: Secondary | ICD-10-CM | POA: Insufficient documentation

## 2016-06-20 DIAGNOSIS — Z01812 Encounter for preprocedural laboratory examination: Secondary | ICD-10-CM | POA: Insufficient documentation

## 2016-06-20 LAB — CBC
HCT: 43.8 % (ref 39.0–52.0)
HEMOGLOBIN: 15.1 g/dL (ref 13.0–17.0)
MCH: 29.8 pg (ref 26.0–34.0)
MCHC: 34.5 g/dL (ref 30.0–36.0)
MCV: 86.6 fL (ref 78.0–100.0)
Platelets: 249 10*3/uL (ref 150–400)
RBC: 5.06 MIL/uL (ref 4.22–5.81)
RDW: 12.5 % (ref 11.5–15.5)
WBC: 9.8 10*3/uL (ref 4.0–10.5)

## 2016-06-22 ENCOUNTER — Encounter (HOSPITAL_COMMUNITY)
Admission: RE | Disposition: A | Discharge status: Home / Self Care | Payer: Self-pay | Source: Ambulatory Visit | Attending: Orthopaedic Surgery

## 2016-06-22 ENCOUNTER — Ambulatory Visit (HOSPITAL_COMMUNITY): Payer: Commercial Managed Care - HMO | Admitting: Certified Registered Nurse Anesthetist

## 2016-06-22 ENCOUNTER — Encounter (HOSPITAL_COMMUNITY): Payer: Self-pay | Admitting: *Deleted

## 2016-06-22 ENCOUNTER — Ambulatory Visit (HOSPITAL_COMMUNITY)
Admission: RE | Admit: 2016-06-22 | Discharge: 2016-06-22 | Disposition: A | Discharge status: Home / Self Care | Payer: Commercial Managed Care - HMO | Source: Ambulatory Visit | Attending: Orthopaedic Surgery | Admitting: Orthopaedic Surgery

## 2016-06-22 DIAGNOSIS — M23204 Derangement of unspecified medial meniscus due to old tear or injury, left knee: Secondary | ICD-10-CM

## 2016-06-22 DIAGNOSIS — Z87891 Personal history of nicotine dependence: Secondary | ICD-10-CM | POA: Insufficient documentation

## 2016-06-22 DIAGNOSIS — M23205 Derangement of unspecified medial meniscus due to old tear or injury, unspecified knee: Secondary | ICD-10-CM

## 2016-06-22 DIAGNOSIS — M23232 Derangement of other medial meniscus due to old tear or injury, left knee: Secondary | ICD-10-CM | POA: Insufficient documentation

## 2016-06-22 HISTORY — DX: Derangement of unspecified medial meniscus due to old tear or injury, unspecified knee: M23.205

## 2016-06-22 HISTORY — PX: KNEE ARTHROSCOPY WITH MEDIAL MENISECTOMY: SHX5651

## 2016-06-22 SURGERY — ARTHROSCOPY, KNEE, WITH MEDIAL MENISCECTOMY
Anesthesia: General | Site: Knee | Laterality: Left

## 2016-06-22 MED ORDER — OXYCODONE-ACETAMINOPHEN 5-325 MG PO TABS
1.0000 | ORAL_TABLET | Freq: Once | ORAL | Status: AC
  Administered 2016-06-22: 1 via ORAL
  Filled 2016-06-22: qty 1

## 2016-06-22 MED ORDER — LACTATED RINGERS IR SOLN
Status: DC | PRN
  Administered 2016-06-22: 6000 mL

## 2016-06-22 MED ORDER — MORPHINE SULFATE (PF) 4 MG/ML IV SOLN
INTRAVENOUS | Status: DC | PRN
  Administered 2016-06-22: 4 mg

## 2016-06-22 MED ORDER — LIDOCAINE 2% (20 MG/ML) 5 ML SYRINGE
INTRAMUSCULAR | Status: DC | PRN
  Administered 2016-06-22: 100 mg via INTRAVENOUS

## 2016-06-22 MED ORDER — PROMETHAZINE HCL 12.5 MG PO TABS: 12.5000 mg | ORAL_TABLET | Freq: Four times a day (QID) | ORAL | 0 refills | Status: DC | PRN

## 2016-06-22 MED ORDER — FENTANYL CITRATE (PF) 100 MCG/2ML IJ SOLN
INTRAMUSCULAR | Status: AC
  Filled 2016-06-22: qty 2

## 2016-06-22 MED ORDER — PROPOFOL 10 MG/ML IV BOLUS
INTRAVENOUS | Status: AC
  Filled 2016-06-22: qty 20

## 2016-06-22 MED ORDER — FENTANYL CITRATE (PF) 100 MCG/2ML IJ SOLN
INTRAMUSCULAR | Status: DC | PRN
  Administered 2016-06-22 (×2): 50 ug via INTRAVENOUS

## 2016-06-22 MED ORDER — FENTANYL CITRATE (PF) 100 MCG/2ML IJ SOLN
25.0000 ug | INTRAMUSCULAR | Status: DC | PRN
  Administered 2016-06-22 (×2): 50 ug via INTRAVENOUS

## 2016-06-22 MED ORDER — PROPOFOL 10 MG/ML IV BOLUS
INTRAVENOUS | Status: DC | PRN
Start: 2016-06-22 — End: 2016-06-22
  Administered 2016-06-22: 180 mg via INTRAVENOUS

## 2016-06-22 MED ORDER — MEPERIDINE HCL 50 MG/ML IJ SOLN: 6.2500 mg | INTRAMUSCULAR | Status: DC | PRN

## 2016-06-22 MED ORDER — DEXAMETHASONE SODIUM PHOSPHATE 10 MG/ML IJ SOLN
INTRAMUSCULAR | Status: DC | PRN
  Administered 2016-06-22: 10 mg via INTRAVENOUS

## 2016-06-22 MED ORDER — OXYCODONE-ACETAMINOPHEN 5-325 MG PO TABS: 1.0000 | ORAL_TABLET | ORAL | 0 refills | Status: DC | PRN

## 2016-06-22 MED ORDER — BUPIVACAINE HCL (PF) 0.5 % IJ SOLN
INTRAMUSCULAR | Status: DC | PRN
Start: 2016-06-22 — End: 2016-06-22
  Administered 2016-06-22: 20 mL

## 2016-06-22 MED ORDER — MIDAZOLAM HCL 5 MG/5ML IJ SOLN
INTRAMUSCULAR | Status: DC | PRN
  Administered 2016-06-22: 2 mg via INTRAVENOUS

## 2016-06-22 MED ORDER — LACTATED RINGERS IV SOLN
INTRAVENOUS | Status: DC
  Administered 2016-06-22: 11:00:00 via INTRAVENOUS

## 2016-06-22 MED ORDER — LACTATED RINGERS IV SOLN: INTRAVENOUS | Status: DC

## 2016-06-22 MED ORDER — METOCLOPRAMIDE HCL 5 MG/ML IJ SOLN: 10.0000 mg | Freq: Once | INTRAMUSCULAR | Status: DC | PRN

## 2016-06-22 MED ORDER — ACETAMINOPHEN 10 MG/ML IV SOLN
INTRAVENOUS | Status: AC
  Administered 2016-06-22: 1000 mg via INTRAVENOUS
  Filled 2016-06-22: qty 100

## 2016-06-22 MED ORDER — CEFAZOLIN SODIUM-DEXTROSE 2-4 GM/100ML-% IV SOLN
2.0000 g | INTRAVENOUS | Status: AC
  Administered 2016-06-22: 2 g via INTRAVENOUS
  Filled 2016-06-22: qty 100

## 2016-06-22 MED ORDER — CEFAZOLIN SODIUM-DEXTROSE 2-4 GM/100ML-% IV SOLN
INTRAVENOUS | Status: AC
  Filled 2016-06-22: qty 100

## 2016-06-22 MED ORDER — STERILE WATER FOR IRRIGATION IR SOLN
Status: DC | PRN
  Administered 2016-06-22: 500 mL

## 2016-06-22 MED ORDER — DEXAMETHASONE SODIUM PHOSPHATE 10 MG/ML IJ SOLN
INTRAMUSCULAR | Status: AC
  Filled 2016-06-22: qty 1

## 2016-06-22 MED ORDER — MIDAZOLAM HCL 2 MG/2ML IJ SOLN
INTRAMUSCULAR | Status: AC
  Filled 2016-06-22: qty 2

## 2016-06-22 MED ORDER — ONDANSETRON HCL 4 MG/2ML IJ SOLN
INTRAMUSCULAR | Status: DC | PRN
  Administered 2016-06-22: 4 mg via INTRAVENOUS

## 2016-06-22 MED ORDER — BUPIVACAINE HCL (PF) 0.5 % IJ SOLN
INTRAMUSCULAR | Status: AC
  Filled 2016-06-22: qty 30

## 2016-06-22 MED ORDER — ONDANSETRON HCL 4 MG/2ML IJ SOLN
INTRAMUSCULAR | Status: AC
  Filled 2016-06-22: qty 2

## 2016-06-22 MED ORDER — MORPHINE SULFATE (PF) 4 MG/ML IV SOLN
INTRAVENOUS | Status: AC
  Filled 2016-06-22: qty 1

## 2016-06-22 MED ORDER — ACETAMINOPHEN 10 MG/ML IV SOLN
1000.0000 mg | Freq: Once | INTRAVENOUS | Status: AC
  Administered 2016-06-22: 1000 mg via INTRAVENOUS

## 2016-06-22 SURGICAL SUPPLY — 27 items
BANDAGE ACE 6X5 VEL STRL LF (GAUZE/BANDAGES/DRESSINGS) ×2 IMPLANT
BLADE CUDA GRT WHITE 3.5 (BLADE) ×1 IMPLANT
BLADE CUDA SHAVER 3.5 (BLADE) ×2 IMPLANT
CUFF TOURN SGL QUICK 34 (TOURNIQUET CUFF)
CUFF TRNQT CYL 34X4X40X1 (TOURNIQUET CUFF) IMPLANT
DRAPE U-SHAPE 47X51 STRL (DRAPES) ×2 IMPLANT
DRSG PAD ABDOMINAL 8X10 ST (GAUZE/BANDAGES/DRESSINGS) ×2 IMPLANT
DURAPREP 26ML APPLICATOR (WOUND CARE) ×2 IMPLANT
GAUZE XEROFORM 1X8 LF (GAUZE/BANDAGES/DRESSINGS) ×2 IMPLANT
GAUZE XEROFORM 4X4 STRL (GAUZE/BANDAGES/DRESSINGS) ×1 IMPLANT
GLOVE BIO SURGEON STRL SZ7.5 (GLOVE) ×2 IMPLANT
GLOVE BIOGEL PI IND STRL 8 (GLOVE) ×2 IMPLANT
GLOVE BIOGEL PI INDICATOR 8 (GLOVE) ×2
GLOVE ECLIPSE 8.0 STRL XLNG CF (GLOVE) ×2 IMPLANT
GOWN STRL REUS W/TWL XL LVL3 (GOWN DISPOSABLE) ×4 IMPLANT
KIT BASIN OR (CUSTOM PROCEDURE TRAY) ×1 IMPLANT
MANIFOLD NEPTUNE II (INSTRUMENTS) ×2 IMPLANT
PACK ARTHROSCOPY WL (CUSTOM PROCEDURE TRAY) ×2 IMPLANT
PADDING CAST ABS 6INX4YD NS (CAST SUPPLIES) ×1
PADDING CAST ABS COTTON 6X4 NS (CAST SUPPLIES) IMPLANT
PADDING CAST COTTON 6X4 STRL (CAST SUPPLIES) ×2 IMPLANT
POSITIONER SURGICAL ARM (MISCELLANEOUS) ×2 IMPLANT
SUT ETHILON 4 0 PS 2 18 (SUTURE) ×2 IMPLANT
SYR CONTROL 10ML LL (SYRINGE) ×2 IMPLANT
TOWEL OR 17X26 10 PK STRL BLUE (TOWEL DISPOSABLE) ×2 IMPLANT
WAND HAND CNTRL MULTIVAC 90 (MISCELLANEOUS) IMPLANT
WRAP KNEE MAXI GEL POST OP (GAUZE/BANDAGES/DRESSINGS) ×2 IMPLANT

## 2016-06-22 NOTE — Anesthesia Preprocedure Evaluation (Signed)
Anesthesia Evaluation  Patient identified by MRN, date of birth, ID band Patient awake    Reviewed: Allergy & Precautions, NPO status , Patient's Chart, lab work & pertinent test results  Airway Mallampati: II  TM Distance: >3 FB Neck ROM: Full    Dental no notable dental hx.    Pulmonary neg pulmonary ROS, former smoker,    Pulmonary exam normal breath sounds clear to auscultation       Cardiovascular negative cardio ROS Normal cardiovascular exam Rhythm:Regular Rate:Normal     Neuro/Psych negative neurological ROS  negative psych ROS   GI/Hepatic negative GI ROS, Neg liver ROS,   Endo/Other  negative endocrine ROS  Renal/GU negative Renal ROS  negative genitourinary   Musculoskeletal negative musculoskeletal ROS (+)   Abdominal   Peds negative pediatric ROS (+)  Hematology negative hematology ROS (+)   Anesthesia Other Findings   Reproductive/Obstetrics negative OB ROS                            Anesthesia Physical Anesthesia Plan  ASA: I  Anesthesia Plan: General   Post-op Pain Management:    Induction: Intravenous  Airway Management Planned: LMA  Additional Equipment:   Intra-op Plan:   Post-operative Plan: Extubation in OR  Informed Consent: I have reviewed the patients History and Physical, chart, labs and discussed the procedure including the risks, benefits and alternatives for the proposed anesthesia with the patient or authorized representative who has indicated his/her understanding and acceptance.   Dental advisory given  Plan Discussed with: CRNA  Anesthesia Plan Comments:         Anesthesia Quick Evaluation  

## 2016-06-22 NOTE — Transfer of Care (Signed)
Immediate Anesthesia Transfer of Care Note  Patient: Kevin Navarro  Procedure(s) Performed: Procedure(s): LEFT KNEE ARTHROSCOPY WITH PARTIAL MEDIAL MENISCECTOMY (Left)  Patient Location: PACU  Anesthesia Type:General  Level of Consciousness:  sedated, patient cooperative and responds to stimulation  Airway & Oxygen Therapy:Patient Spontanous Breathing and Patient connected to face mask oxgen  Post-op Assessment:  Report given to PACU RN and Post -op Vital signs reviewed and stable  Post vital signs:  Reviewed and stable  Last Vitals: There were no vitals filed for this visit.  Complications: No apparent anesthesia complications

## 2016-06-22 NOTE — Anesthesia Procedure Notes (Signed)
Procedure Name: LMA Insertion Date/Time: 06/22/2016 1:13 AM Performed by: Wynonia SoursWALKER, Braniya Farrugia L Pre-anesthesia Checklist: Patient identified, Emergency Drugs available, Suction available, Patient being monitored and Timeout performed Patient Re-evaluated:Patient Re-evaluated prior to inductionOxygen Delivery Method: Circle system utilized Preoxygenation: Pre-oxygenation with 100% oxygen Intubation Type: IV induction Ventilation: Mask ventilation without difficulty LMA: LMA with gastric port inserted LMA Size: 5.0 Number of attempts: 1 Placement Confirmation: positive ETCO2 and breath sounds checked- equal and bilateral Tube secured with: Tape Dental Injury: Teeth and Oropharynx as per pre-operative assessment

## 2016-06-22 NOTE — Anesthesia Postprocedure Evaluation (Signed)
Anesthesia Post Note  Patient: Kevin Navarro  Procedure(s) Performed: Procedure(s) (LRB): LEFT KNEE ARTHROSCOPY WITH PARTIAL MEDIAL MENISCECTOMY (Left)  Patient location during evaluation: PACU Anesthesia Type: General Level of consciousness: awake and alert Pain management: pain level controlled Vital Signs Assessment: post-procedure vital signs reviewed and stable Respiratory status: spontaneous breathing, nonlabored ventilation, respiratory function stable and patient connected to nasal cannula oxygen Cardiovascular status: blood pressure returned to baseline and stable Postop Assessment: no signs of nausea or vomiting Anesthetic complications: no       Last Vitals:  Vitals:   06/22/16 1259 06/22/16 1346  BP: (!) 137/96 137/85  Pulse: 62 66  Resp: 14 16  Temp: 36.9 C 36.6 C    Last Pain:  Vitals:   06/22/16 1346  TempSrc: Oral  PainSc: 3                  Phillips Groutarignan, Khalfani Weideman

## 2016-06-22 NOTE — Brief Op Note (Signed)
06/22/2016  11:45 AM  PATIENT:  Vanita Pandadgar M Rodriguez  41 y.o. male  PRE-OPERATIVE DIAGNOSIS:  left knee medial meniscal tear  POST-OPERATIVE DIAGNOSIS:  left knee medial meniscal tear  PROCEDURE:  Procedure(s): LEFT KNEE ARTHROSCOPY WITH PARTIAL MEDIAL MENISCECTOMY (Left)  SURGEON:  Surgeon(s) and Role:    * Kathryne Hitchhristopher Y Nyjah Denio, MD - Primary  PHYSICIAN ASSISTANT: Rexene EdisonGil Clark, PA-C  ANESTHESIA:   local and general  COUNTS:  YES  TOURNIQUET:  * No tourniquets in log *  DICTATION: .Other Dictation: Dictation Number [id number]  PLAN OF CARE: Discharge to home after PACU  PATIENT DISPOSITION:  PACU - hemodynamically stable.   Delay start of Pharmacological VTE agent (>24hrs) due to surgical blood loss or risk of bleeding: no

## 2016-06-22 NOTE — H&P (Signed)
Kevin Navarro is an 41 y.o. male.   Chief Complaint:   Left knee pain with locking and catching HPI:   41 yo male with left knee locking and catching and a known large medial meniscal tear.  At this point, he does wish to proceed with surgery due to his worsening mechanical symptoms.  His MRI confirms a large bucket-handle type teat of his left knee medial meniscus.  No past medical history on file.  No past surgical history on file.  No family history on file. Social History:  reports that he quit smoking 4 days ago. He has never used smokeless tobacco. He reports that he drinks about 0.6 oz of alcohol per week . He reports that he does not use drugs.  Allergies:  Allergies  Allergen Reactions  . Bee Venom Anaphylaxis  . Hydrocodone Nausea And Vomiting    No prescriptions prior to admission.    Results for orders placed or performed during the hospital encounter of 06/20/16 (from the past 48 hour(s))  CBC     Status: None   Collection Time: 06/20/16  8:53 AM  Result Value Ref Range   WBC 9.8 4.0 - 10.5 K/uL   RBC 5.06 4.22 - 5.81 MIL/uL   Hemoglobin 15.1 13.0 - 17.0 g/dL   HCT 40.943.8 81.139.0 - 91.452.0 %   MCV 86.6 78.0 - 100.0 fL   MCH 29.8 26.0 - 34.0 pg   MCHC 34.5 30.0 - 36.0 g/dL   RDW 78.212.5 95.611.5 - 21.315.5 %   Platelets 249 150 - 400 K/uL   No results found.  Review of Systems  All other systems reviewed and are negative.   There were no vitals taken for this visit. Physical Exam  Constitutional: He is oriented to person, place, and time. He appears well-developed and well-nourished.  HENT:  Head: Normocephalic and atraumatic.  Eyes: EOM are normal. Pupils are equal, round, and reactive to light.  Neck: Normal range of motion. Neck supple.  Cardiovascular: Normal rate and regular rhythm.   Respiratory: Effort normal and breath sounds normal.  GI: Soft. Bowel sounds are normal.  Musculoskeletal:       Left knee: He exhibits decreased range of motion, swelling and  effusion. Tenderness found. Medial joint line tenderness noted.  Neurological: He is alert and oriented to person, place, and time.  Skin: Skin is warm and dry.  Psychiatric: He has a normal mood and affect.     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  Kathryne Hitchhristopher Y Pinky Ravan, MD 06/22/2016, 7:32 AM

## 2016-06-22 NOTE — Discharge Instructions (Signed)
General Anesthesia, Adult, Care After These instructions provide you with information about caring for yourself after your procedure. Your health care provider may also give you more specific instructions. Your treatment has been planned according to current medical practices, but problems sometimes occur. Call your health care provider if you have any problems or questions after your procedure. What can I expect after the procedure? After the procedure, it is common to have:  Vomiting.  A sore throat.  Mental slowness. It is common to feel:  Nauseous.  Cold or shivery.  Sleepy.  Tired.  Sore or achy, even in parts of your body where you did not have surgery. Follow these instructions at home: For at least 24 hours after the procedure:  Do not:  Participate in activities where you could fall or become injured.  Drive.  Use heavy machinery.  Drink alcohol.  Take sleeping pills or medicines that cause drowsiness.  Make important decisions or sign legal documents.  Take care of children on your own.  Rest. Eating and drinking  If you vomit, drink water, juice, or soup when you can drink without vomiting.  Drink enough fluid to keep your urine clear or pale yellow.  Make sure you have little or no nausea before eating solid foods.  Follow the diet recommended by your health care provider. General instructions  Have a responsible adult stay with you until you are awake and alert.  Return to your normal activities as told by your health care provider. Ask your health care provider what activities are safe for you.  Take over-the-counter and prescription medicines only as told by your health care provider.  If you smoke, do not smoke without supervision.  Keep all follow-up visits as told by your health care provider. This is important. Contact a health care provider if:  You continue to have nausea or vomiting at home, and medicines are not helpful.  You  cannot drink fluids or start eating again.  You cannot urinate after 8-12 hours.  You develop a skin rash.  You have fever.  You have increasing redness at the site of your procedure. Get help right away if:  You have difficulty breathing.  You have chest pain.  You have unexpected bleeding.  You feel that you are having a life-threatening or urgent problem. This information is not intended to replace advice given to you by your health care provider. Make sure you discuss any questions you have with your health care provider. Document Released: 09/10/2000 Document Revised: 11/07/2015 Document Reviewed: 05/19/2015 Elsevier Interactive Patient Education  2017 [employer]     Increase your activities as comfort allows. You can put full weight on your left knee/leg. Ice and elevation as needed for swelling. You may remove your dressings tomorrow and get your actual incisions wet in the shower. Place daily band-aides over the incision.

## 2016-06-25 ENCOUNTER — Encounter (HOSPITAL_COMMUNITY): Payer: Self-pay | Admitting: Orthopaedic Surgery

## 2016-06-25 NOTE — Op Note (Signed)
Kevin Navarro, Kevin Navarro             ACCOUNT NO.:  [id number]  MEDICAL RECORD NO.:  [id number]  LOCATION:                                 FACILITY:  PHYSICIAN:  Vanita Panda. Magnus Ivan, M.D.DATE OF BIRTH:  [birth year]  DATE OF PROCEDURE:  06/22/2016 DATE OF DISCHARGE:  06/22/2016                              OPERATIVE REPORT   PREOPERATIVE DIAGNOSIS:  Left knee chronic bucket-handle medial meniscal tear.  POSTOPERATIVE DIAGNOSIS:  Left knee chronic bucket-handle medial meniscal tear.  PROCEDURE:  Left knee arthroscopy with partial medial meniscectomy.  FINDINGS:  Large chronic bucket-handle tear of the medial meniscus of the left knee.  SURGEON:  Vanita Panda. Magnus Ivan, M.D.  ASSISTANT:  Richardean Canal, PA-C.  ANESTHESIA: 1. General. 2. Local with mixture of Marcaine and morphine.  BLOOD LOSS:  Minimal.  COMPLICATIONS:  None.  INDICATIONS:  [name] is a 41 year old gentleman, well known to me.  Several years ago, he sustained a twisting injury of his left knee and developed a bucket-handle meniscal tear of the left knee medial meniscus.  Back down, we did recommend the surgery, but he deferred this.  He is now over the last several years developed even worsening, locking and catching of his knee and there was definitely a flap tear of the meniscus.  At this point, he does wish to proceed with an operative intervention.  We discussed with him the risks and benefits of surgery including the understanding that he will likely end up with down the road as medial compartment arthritic changes given the nature of this injury.  PROCEDURE DESCRIPTION:  After informed consent was obtained, appropriate left knee was marked.  He was brought to the operating room.  General anesthesia was obtained when he was placed supine on the operating table.  A lateral leg post was utilized and the left leg was prepped and draped from the thigh down to the ankle with DuraPrep and sterile  drapes including a sterile stockinette.  A time-out was called and he was identified as correct patient and correct left knee.  With the bed raised, the left knee was flexed off the side of the table.  I made an anterolateral arthroscopy portal and entered the knee with a camera and right away, he has had a large bucket-handle medial meniscal tear that was actually caught in the notch of the knee.  We made an anteromedial incision and using up-cutting and straight biters as well as two different arthroscopic shavers, we were able to perform a partial medial meniscectomy removing the large flap component.  We then probed the compartment of the medial aspect of the knee and found the cartilage to be intact.  His ACL and PCL were intact, and the lateral compartment was pristine.  The patellofemoral joint looked good as well.  We then allowed fluid to lavage the knee and then drained all fluid from the knee.  We closed the portal sites with interrupted nylon suture.  We placed a mixture of morphine and Marcaine into the portal sites and the knee joint itself.  Well-padded sterile dressing was applied.  He was awakened, extubated and taken to the recovery room in stable condition. All final counts  were correct.  There were no complications noted. Postoperatively, we will discharge him home with increasing activities as he tolerates.  We will see him back in the office in a week.     Vanita Pandahristopher Y. Magnus IvanBlackman, M.D.   ______________________________ Vanita Pandahristopher Y. Magnus IvanBlackman, M.D.    CYB/MEDQ  D:  06/22/2016  T:  06/23/2016  Job:  [id number]

## 2016-06-27 NOTE — Addendum Note (Signed)
Addendum  created 06/27/16 1542 by Elyn PeersSandra J Teauna Dubach, CRNA   Anesthesia Event edited, Anesthesia Intra Meds edited

## 2016-07-04 ENCOUNTER — Inpatient Hospital Stay (INDEPENDENT_AMBULATORY_CARE_PROVIDER_SITE_OTHER): Payer: Self-pay | Admitting: Orthopaedic Surgery

## 2016-07-05 ENCOUNTER — Inpatient Hospital Stay (INDEPENDENT_AMBULATORY_CARE_PROVIDER_SITE_OTHER): Payer: Self-pay | Admitting: Physician Assistant

## 2016-07-07 ENCOUNTER — Encounter (INDEPENDENT_AMBULATORY_CARE_PROVIDER_SITE_OTHER): Payer: Self-pay | Admitting: Orthopaedic Surgery

## 2016-07-07 ENCOUNTER — Ambulatory Visit (INDEPENDENT_AMBULATORY_CARE_PROVIDER_SITE_OTHER): Payer: Self-pay | Admitting: Orthopaedic Surgery

## 2016-07-07 DIAGNOSIS — Z9889 Other specified postprocedural states: Secondary | ICD-10-CM

## 2016-07-07 HISTORY — DX: Other specified postprocedural states: Z98.890

## 2016-07-07 NOTE — Progress Notes (Signed)
The patient is just over week status post a left knee arthroscopy with a partial medial meniscectomy. He had a large chronic bucket-handle tear of the medial meniscus. We were able to perform a partial medial meniscectomy debriding the meniscus back to stable margin and still leaving some meniscal tissue. Fortunately his cartilage was intact in his knee. He says he's being careful with pivoting activities he does have some pain in his knee but overall he is doing well.  On examination of the knee there is just a mild effusion of the left knee. He has got good range of motion of the knee. His knee feels ligamentously stable. We removed the sutures.  At this point of slowly increase his activities but avoid contact sports and significant pivoting activities for the next month. I'll see him back in a month to see how is doing overall.

## 2016-08-06 ENCOUNTER — Ambulatory Visit (INDEPENDENT_AMBULATORY_CARE_PROVIDER_SITE_OTHER): Payer: Self-pay | Admitting: Orthopaedic Surgery

## 2016-10-08 ENCOUNTER — Encounter (HOSPITAL_COMMUNITY)
Admission: EM | Disposition: A | Discharge status: Home / Self Care | Payer: Self-pay | Source: Ambulatory Visit | Attending: Cardiovascular Disease

## 2016-10-08 ENCOUNTER — Emergency Department (HOSPITAL_COMMUNITY)
Admission: EM | Admit: 2016-10-08 | Discharge: 2016-10-08 | Payer: Commercial Managed Care - HMO | Source: Home / Self Care | Attending: Emergency Medicine | Admitting: Emergency Medicine

## 2016-10-08 ENCOUNTER — Ambulatory Visit (INDEPENDENT_AMBULATORY_CARE_PROVIDER_SITE_OTHER)
Admission: EM | Admit: 2016-10-08 | Discharge: 2016-10-08 | Disposition: A | Discharge status: Nursing Home (Includes ICF) | Payer: Commercial Managed Care - HMO | Source: Home / Self Care

## 2016-10-08 ENCOUNTER — Observation Stay (HOSPITAL_COMMUNITY)
Admission: EM | Admit: 2016-10-08 | Discharge: 2016-10-09 | Disposition: A | Discharge status: Home / Self Care | Payer: Commercial Managed Care - HMO | Source: Ambulatory Visit | Attending: Cardiovascular Disease | Admitting: Cardiovascular Disease

## 2016-10-08 ENCOUNTER — Encounter (HOSPITAL_COMMUNITY): Payer: Self-pay | Admitting: Emergency Medicine

## 2016-10-08 ENCOUNTER — Encounter (HOSPITAL_COMMUNITY): Payer: Self-pay | Admitting: Cardiovascular Disease

## 2016-10-08 DIAGNOSIS — R0602 Shortness of breath: Secondary | ICD-10-CM | POA: Insufficient documentation

## 2016-10-08 DIAGNOSIS — R079 Chest pain, unspecified: Secondary | ICD-10-CM | POA: Diagnosis not present

## 2016-10-08 DIAGNOSIS — R9431 Abnormal electrocardiogram [ECG] [EKG]: Secondary | ICD-10-CM

## 2016-10-08 DIAGNOSIS — R0789 Other chest pain: Secondary | ICD-10-CM | POA: Diagnosis present

## 2016-10-08 DIAGNOSIS — Z8249 Family history of ischemic heart disease and other diseases of the circulatory system: Secondary | ICD-10-CM

## 2016-10-08 DIAGNOSIS — R072 Precordial pain: Principal | ICD-10-CM

## 2016-10-08 DIAGNOSIS — I10 Essential (primary) hypertension: Secondary | ICD-10-CM

## 2016-10-08 DIAGNOSIS — Z823 Family history of stroke: Secondary | ICD-10-CM | POA: Insufficient documentation

## 2016-10-08 DIAGNOSIS — Z87891 Personal history of nicotine dependence: Secondary | ICD-10-CM

## 2016-10-08 HISTORY — DX: Chest pain, unspecified: R07.9

## 2016-10-08 HISTORY — PX: LEFT HEART CATH AND CORONARY ANGIOGRAPHY: CATH118249

## 2016-10-08 HISTORY — DX: Essential (primary) hypertension: I10

## 2016-10-08 SURGERY — LEFT HEART CATH AND CORONARY ANGIOGRAPHY
Anesthesia: LOCAL

## 2016-10-08 MED ORDER — NITROGLYCERIN 0.4 MG SL SUBL
SUBLINGUAL_TABLET | SUBLINGUAL | Status: AC
  Filled 2016-10-08: qty 1

## 2016-10-08 MED ORDER — MIDAZOLAM HCL 2 MG/2ML IJ SOLN
INTRAMUSCULAR | Status: AC
  Filled 2016-10-08: qty 2

## 2016-10-08 MED ORDER — ONDANSETRON HCL 4 MG/2ML IJ SOLN: 4.0000 mg | Freq: Four times a day (QID) | INTRAMUSCULAR | Status: DC | PRN

## 2016-10-08 MED ORDER — MIDAZOLAM HCL 2 MG/2ML IJ SOLN
INTRAMUSCULAR | Status: DC | PRN
  Administered 2016-10-08: 2 mg via INTRAVENOUS

## 2016-10-08 MED ORDER — VERAPAMIL HCL 2.5 MG/ML IV SOLN
INTRAVENOUS | Status: AC
  Filled 2016-10-08: qty 2

## 2016-10-08 MED ORDER — ASPIRIN 81 MG PO CHEW
324.0000 mg | CHEWABLE_TABLET | Freq: Once | ORAL | Status: AC
  Administered 2016-10-08: 324 mg via ORAL

## 2016-10-08 MED ORDER — FENTANYL CITRATE (PF) 100 MCG/2ML IJ SOLN
INTRAMUSCULAR | Status: AC
  Filled 2016-10-08: qty 2

## 2016-10-08 MED ORDER — ACETAMINOPHEN 325 MG PO TABS: 650.0000 mg | ORAL_TABLET | ORAL | Status: DC | PRN

## 2016-10-08 MED ORDER — FENTANYL CITRATE (PF) 100 MCG/2ML IJ SOLN
INTRAMUSCULAR | Status: DC | PRN
  Administered 2016-10-08: 50 ug via INTRAVENOUS

## 2016-10-08 MED ORDER — IOPAMIDOL (ISOVUE-370) INJECTION 76%
INTRAVENOUS | Status: DC | PRN
  Administered 2016-10-08: 60 mL

## 2016-10-08 MED ORDER — LIDOCAINE HCL (PF) 1 % IJ SOLN
INTRAMUSCULAR | Status: AC
  Filled 2016-10-08: qty 30

## 2016-10-08 MED ORDER — HEPARIN (PORCINE) IN NACL 2-0.9 UNIT/ML-% IJ SOLN
INTRAMUSCULAR | Status: AC
  Filled 2016-10-08: qty 1000

## 2016-10-08 MED ORDER — SODIUM CHLORIDE 0.9% FLUSH
3.0000 mL | Freq: Two times a day (BID) | INTRAVENOUS | Status: DC
  Administered 2016-10-09: 3 mL via INTRAVENOUS

## 2016-10-08 MED ORDER — SODIUM CHLORIDE 0.9% FLUSH: 3.0000 mL | INTRAVENOUS | Status: DC | PRN

## 2016-10-08 MED ORDER — SODIUM CHLORIDE 0.9 % IV SOLN
Freq: Once | INTRAVENOUS | Status: AC
  Administered 2016-10-08: 21:00:00 via INTRAVENOUS

## 2016-10-08 MED ORDER — HEPARIN SODIUM (PORCINE) 5000 UNIT/ML IJ SOLN
4000.0000 [IU] | Freq: Once | INTRAMUSCULAR | Status: AC
  Administered 2016-10-08: 4000 [IU] via INTRAVENOUS

## 2016-10-08 MED ORDER — HEPARIN (PORCINE) IN NACL 2-0.9 UNIT/ML-% IJ SOLN
INTRAMUSCULAR | Status: DC | PRN
  Administered 2016-10-08: 10 mL via INTRA_ARTERIAL

## 2016-10-08 MED ORDER — LIDOCAINE HCL (PF) 1 % IJ SOLN
INTRAMUSCULAR | Status: DC | PRN
  Administered 2016-10-08: 2 mL

## 2016-10-08 MED ORDER — ASPIRIN 81 MG PO CHEW
CHEWABLE_TABLET | ORAL | Status: AC
  Filled 2016-10-08: qty 4

## 2016-10-08 MED ORDER — HEPARIN (PORCINE) IN NACL 2-0.9 UNIT/ML-% IJ SOLN
INTRAMUSCULAR | Status: DC | PRN
  Administered 2016-10-08: 21:00:00

## 2016-10-08 MED ORDER — SODIUM CHLORIDE 0.9 % IV SOLN
INTRAVENOUS | Status: AC
  Administered 2016-10-08: 23:00:00 via INTRAVENOUS

## 2016-10-08 MED ORDER — NITROGLYCERIN 0.4 MG SL SUBL
0.4000 mg | SUBLINGUAL_TABLET | SUBLINGUAL | Status: DC | PRN
  Administered 2016-10-08 (×2): 0.4 mg via SUBLINGUAL

## 2016-10-08 MED ORDER — SODIUM CHLORIDE 0.9 % IV SOLN: 250.0000 mL | INTRAVENOUS | Status: DC | PRN

## 2016-10-08 MED ORDER — IOPAMIDOL (ISOVUE-370) INJECTION 76%
INTRAVENOUS | Status: AC
  Filled 2016-10-08: qty 100

## 2016-10-08 MED ORDER — METOPROLOL TARTRATE 25 MG PO TABS
25.0000 mg | ORAL_TABLET | Freq: Two times a day (BID) | ORAL | Status: DC
  Administered 2016-10-08 – 2016-10-09 (×2): 25 mg via ORAL
  Filled 2016-10-08 (×2): qty 1

## 2016-10-08 SURGICAL SUPPLY — 10 items
CATH 5FR JL3.5 JR4 ANG PIG MP (CATHETERS) ×1 IMPLANT
DEVICE RAD COMP TR BAND LRG (VASCULAR PRODUCTS) ×1 IMPLANT
GLIDESHEATH SLEND SS 6F .021 (SHEATH) ×1 IMPLANT
GUIDEWIRE INQWIRE 1.5J.035X260 (WIRE) IMPLANT
INQWIRE 1.5J .035X260CM (WIRE) ×2
KIT HEART LEFT (KITS) ×2 IMPLANT
PACK CARDIAC CATHETERIZATION (CUSTOM PROCEDURE TRAY) ×2 IMPLANT
SYR MEDRAD MARK V 150ML (SYRINGE) ×2 IMPLANT
TRANSDUCER W/STOPCOCK (MISCELLANEOUS) ×2 IMPLANT
TUBING CIL FLEX 10 FLL-RA (TUBING) ×2 IMPLANT

## 2016-10-08 NOTE — Discharge Instructions (Signed)
Although you've had chest pain for 2 days, the EKG does show some new abnormalities that need to be explained with further evaluation in the emergency department

## 2016-10-08 NOTE — ED Triage Notes (Signed)
Pt brought to ED by [facility] from [facility] for chest pressure that started 2 days ago, pt very diaphoretic on arrival, 324 mg ASA and 2 nitro sl given on UC pta.

## 2016-10-08 NOTE — ED Notes (Signed)
Patient placed on bedside cardiac monitor.

## 2016-10-08 NOTE — ED Notes (Signed)
[facility] advised of Code Stemi

## 2016-10-08 NOTE — ED Provider Notes (Signed)
[facility]    CSN: [id number] Arrival date & time: 10/08/16  1854     History   Chief Complaint Chief Complaint  Patient presents with  . Hypertension    HPI Kevin Navarro is a 41 y.o. male.   The patient presented to the [facility] with a complaint of HTN over the last week. The patient stated that he has previously had chest tightness with inspiration but none at this current time. Patient states that he's had multiple elevated blood pressure readings over the years but does not like to go to the doctor.  Over the last day or 2 he's had some bothersome tightness in his chest. He's also had allergies with sinus congestion.  Patient is very physical job and he's had quite a bit of stress recently. He works in [employer] and travels back-and-forth to [employer] for his job. He does not smoke, has no history of elevated cholesterol, has no history of diabetes. Both parents had hypertension and his father suffered a stroke.      History reviewed. No pertinent past medical history.  There are no active problems to display for this patient.   History reviewed. No pertinent surgical history.     Home Medications    Prior to Admission medications   Not on File    Family History History reviewed. No pertinent family history.  Social History Social History  Substance Use Topics  . Smoking status: Former Smoker    Packs/day: 1.00    Years: 10.00    Types: Cigarettes    Quit date: 06/19/2011  . Smokeless tobacco: Never Used  . Alcohol use 3.0 oz/week    5 Cans of beer per week     Allergies   Hydrocodone   Review of Systems Review of Systems  Constitutional: Negative.  Negative for diaphoresis.  HENT: Positive for congestion.   Respiratory: Positive for chest tightness. Negative for cough and shortness of breath.   Gastrointestinal: Negative.   Musculoskeletal: Negative.   Neurological: Negative.   All other systems  reviewed and are negative.    Physical Exam Triage Vital Signs ED Triage Vitals  Enc Vitals Group     BP 10/08/16 1903 (!) 145/85     Pulse Rate 10/08/16 1903 62     Resp 10/08/16 1903 16     Temp 10/08/16 1903 98.1 F (36.7 C)     Temp Source 10/08/16 1903 Oral     SpO2 10/08/16 1903 97 %     Weight --      Height --      Head Circumference --      Peak Flow --      Pain Score 10/08/16 1900 0     Pain Loc --      Pain Edu? --      Excl. in GC? --    No data found.   Updated Vital Signs BP (!) 145/85 (BP Location: Right Arm)   Pulse 62   Temp 98.1 F (36.7 C) (Oral)   Resp 16   SpO2 97%    Physical Exam  Constitutional: He is oriented to person, place, and time. He appears well-developed and well-nourished.  HENT:  Right Ear: External ear normal.  Left Ear: External ear normal.  Mouth/Throat: Oropharynx is clear and moist.  Eyes: Conjunctivae and EOM are normal. Pupils are equal, round, and reactive to light.  Neck: Normal range of motion. Neck supple.  Cardiovascular: Normal rate, regular rhythm and  normal heart sounds.   Pulmonary/Chest: Effort normal and breath sounds normal.  Abdominal: Soft.  Musculoskeletal: Normal range of motion.  Neurological: He is alert and oriented to person, place, and time.  Skin: Skin is warm and dry.  Nursing note and vitals reviewed.    UC Treatments / Results  Labs (all labs ordered are listed, but only abnormal results are displayed) Labs Reviewed - No data to display  EKG  EKG Interpretation None     EKG shows significant ST segment segment elevation in the V2 lead  Radiology No results found.  Procedures Procedures (including critical care time)  Medications Ordered in UC Medications  0.9 %  sodium chloride infusion (not administered)  nitroGLYCERIN (NITROSTAT) SL tablet 0.4 mg (not administered)  aspirin chewable tablet 324 mg (not administered)     Initial Impression / Assessment and Plan / UC  Course  I have reviewed the triage vital signs and the nursing notes.  Pertinent labs & imaging results that were available during my care of the patient were reviewed by me and considered in my medical decision making (see chart for details).    I discussed the EKG with the cardiologist on-call. She was unable to view the EKG but felt that if there was a significant change that we should call a code STEMI and sorted out in the emergency room.  Final Clinical Impressions(s) / UC Diagnoses   Final diagnoses:  Essential hypertension  Chest pain at rest    New Prescriptions New Prescriptions   No medications on file  IV, oxygen, aspirin, and nitroglycerin followed by transfer to emergency department tonight.   Elvina Sidle, MD 10/08/16 2030

## 2016-10-08 NOTE — ED Notes (Signed)
[facility] called, Code STEMI activation

## 2016-10-08 NOTE — Progress Notes (Signed)
Chaplain responded to a code stemi. Pt was alert and able to speak to care team. Chaplain checked for family . None had arrived. Chaplain inform desk to send wife back. She is a former Human resources officer.    10/08/16 2100  Clinical Encounter Type  Visited With Patient  Visit Type Initial  Referral From Nurse  Spiritual Encounters  Spiritual Needs Emotional  Stress Factors  Patient Stress Factors Health changes

## 2016-10-08 NOTE — ED Provider Notes (Signed)
[facility]-EMERGENCY DEPT Provider Note   CSN: [id number] Arrival date & time: 10/08/16  2048     History   Chief Complaint Chief Complaint  Patient presents with  . Code STEMI    HPI Kevin Navarro is a 41 y.o. male.  The history is provided by the patient and the EMS personnel. No language interpreter was used.   SULIMAN TERMINI is a 41 y.o. male who presents to the Emergency Department complaining of chest pain.  He was referred from urgent care for evaluation of chest pain. Yesterday he developed central chest pressure with associated shortness of breath. He went to urgent care for evaluation and was noted to be diaphoretic and had an EKG performed that demonstrated ST elevation in lead V2. He is referred to the emergency department for further evaluation. He reports constant chest pain described as pressure and discomfort since yesterday. No nausea, vomiting, abdominal pain. No prior similar symptoms. His wife checked his blood pressure home and noted elevated and she asked him to get it checked out. He has a family history of hypertension. He denies any known medical problems. He is a former smoker. No past medical history on file.  Patient Active Problem List   Diagnosis Date Noted  . Status post arthroscopic surgery of left knee 07/07/2016  . Meniscus, medial, bucket handle tear, old 06/22/2016    Past Surgical History:  Procedure Laterality Date  . KNEE ARTHROSCOPY WITH MEDIAL MENISECTOMY Left 06/22/2016   Procedure: LEFT KNEE ARTHROSCOPY WITH PARTIAL MEDIAL MENISCECTOMY;  Surgeon: Kathryne Hitch, MD;  Location: [facility];  Service: Orthopedics;  Laterality: Left;       Home Medications    Prior to Admission medications   Medication Sig Start Date End Date Taking? Authorizing Provider  Multiple Vitamin (MULTIVITAMIN WITH MINERALS) TABS tablet Take 1 tablet by mouth daily.    Historical Provider, MD  oxyCODONE-acetaminophen (ROXICET) 5-325 MG tablet Take 1-2  tablets by mouth every 4 (four) hours as needed. 06/22/16   Kathryne Hitch, MD  promethazine (PHENERGAN) 12.5 MG tablet Take 1 tablet (12.5 mg total) by mouth every 6 (six) hours as needed for nausea or vomiting. 06/22/16   Kathryne Hitch, MD    Family History No family history on file.  Social History Social History  Substance Use Topics  . Smoking status: Former Smoker    Quit date: 06/18/2016  . Smokeless tobacco: Never Used  . Alcohol use 0.6 oz/week    1 Cans of beer per week     Comment: 1 beer a week     Allergies   Bee venom and Hydrocodone   Review of Systems Review of Systems  All other systems reviewed and are negative.    Physical Exam Updated Vital Signs BP (!) 171/104 (BP Location: Right Arm)   Pulse 61   Temp 97.9 F (36.6 C) (Oral)   Resp 16   Ht  (1.727 m)   Wt 172 lb (78 kg)   SpO2 99%   BMI 26.15 kg/m   Physical Exam  Constitutional: He is oriented to person, place, and time. He appears well-developed and well-nourished.  HENT:  Head: Normocephalic and atraumatic.  Cardiovascular: Normal rate and regular rhythm.   No murmur heard. Pulmonary/Chest: Effort normal and breath sounds normal. No respiratory distress.  Abdominal: Soft. There is no tenderness. There is no rebound and no guarding.  Musculoskeletal: He exhibits no edema or tenderness.  Neurological: He is alert and oriented  to person, place, and time.  Skin: Skin is warm and dry.  Psychiatric: He has a normal mood and affect. His behavior is normal.  Nursing note and vitals reviewed.    ED Treatments / Results  Labs (all labs ordered are listed, but only abnormal results are displayed) Labs Reviewed - No data to display  EKG  EKG Interpretation None       Radiology No results found.  Procedures Procedures (including critical care time)  Medications Ordered in ED Medications  heparin injection 4,000 Units (4,000 Units Intravenous Given 10/08/16 2058)       Initial Impression / Assessment and Plan / ED Course  I have reviewed the triage vital signs and the nursing notes.  Pertinent labs & imaging results that were available during my care of the patient were reviewed by me and considered in my medical decision making (see chart for details).     Patient referred for evaluation of ST elevation on EKG from urgent care. EKG on ED arrival with resolution of ST elevation. Given patient's history and symptoms plan to take to Cath Lab for urgent intervention for further evaluation of his chest pain with EKG changes. Patient was seen by Dr. Clifton James with cardiology in the emergency department.  Final Clinical Impressions(s) / ED Diagnoses   Final diagnoses:  None    New Prescriptions New Prescriptions   No medications on file     Tilden Fossa, MD 10/08/16 2336

## 2016-10-08 NOTE — H&P (Signed)
     Patient ID: Kevin Navarro MRN: [id number] DOB/AGE: July 01, 1975 41 y.o. Admit date: 10/08/2016  Primary Care Physician: No PCP Per Patient Primary Cardiologist: New  HPI: 41 yo male with history of HTN with chest pressure for several days. He presented to the [facility] tonight. EKG with ST elevation in lead V2. Pt with ongoing central chest pressure. Code STEMI activated and pt taken to cath lab for emergent cardiac cath.  He also has mild dyspnea. Denies weight gain, LE edema, fever, chills, cough, PND, orthopnea, dizziness, syncope.   Review of systems complete and found to be negative unless listed above   Past Medical History:  Diagnosis Date  . HTN (hypertension)     Family History  Problem Relation Age of Onset  . Hypertension Mother     Social History   Social History  . Marital status: Single    Spouse name: N/A  . Number of children: N/A  . Years of education: N/A   Occupational History  . Corporate investment banker    Social History Main Topics  . Smoking status: Former Smoker    Packs/day: 1.00    Years: 10.00    Types: Cigarettes    Quit date: 06/19/2011  . Smokeless tobacco: Never Used  . Alcohol use 3.0 oz/week    5 Cans of beer per week  . Drug use: No  . Sexual activity: Yes    Birth control/ protection: Condom, Coitus interruptus   Other Topics Concern  . Not on file   Social History Narrative  . No narrative on file    Past Surgical History:  Procedure Laterality Date  . None     He has had no surgeries.   Allergies  Allergen Reactions  . Hydrocodone Nausea And Vomiting    Prior to Admission Meds:  Prior to Admission medications   Not on File  None  Physical Exam: Blood pressure (!) 157/102, pulse (!) 54, resp. rate 12, SpO2 97 %.    General: Well developed, well nourished, appears uncomfortable HEENT: OP clear, mucus membranes moist  SKIN: warm, dry. No rashes.  Neuro: No focal deficits  Musculoskeletal: Muscle  strength 5/5 all ext  Psychiatric: Mood and affect normal  Neck: No JVD, no carotid bruits, no thyromegaly, no lymphadenopathy.  Lungs:Clear bilaterally, no wheezes, rhonci, crackles  Cardiovascular: Regular rate and rhythm. No murmurs, gallops or rubs.  Abdomen:Soft. Bowel sounds present. Non-tender.  Extremities: No lower extremity edema. Pulses are 2 + in the bilateral DP/PT.   Labs: Pending   EKG: sinus, ST elevation V2, no reciprocal changes  ASSESSMENT AND PLAN:   1. Chest pain: He has ongoing chest pain with subtle ST changes noted in V2. Will plan emergent cardiac cath and ascending aorta angiography. Further plans to follow cath.   Earney Hamburg, MD 10/08/2016, 9:59 PM

## 2016-10-08 NOTE — ED Triage Notes (Signed)
The patient presented to the [facility] with a complaint of HTN over the last week. The patient stated that he has previously had chest tightness with inspiration but none at this current time.

## 2016-10-08 NOTE — ED Notes (Signed)
[facility] arrives to transport PT 

## 2016-10-08 NOTE — ED Notes (Signed)
Charge Nature conservation officer at [facility] ED advised of Code Stemi.

## 2016-10-08 NOTE — ED Notes (Signed)
Cardiology at the bedside.

## 2016-10-08 NOTE — ED Notes (Signed)
Patient placed on 02 via 2 lpm via N/C

## 2016-10-09 ENCOUNTER — Encounter (HOSPITAL_COMMUNITY): Payer: Self-pay | Admitting: Cardiovascular Disease

## 2016-10-09 ENCOUNTER — Inpatient Hospital Stay (HOSPITAL_COMMUNITY): Payer: Commercial Managed Care - HMO

## 2016-10-09 DIAGNOSIS — Z8249 Family history of ischemic heart disease and other diseases of the circulatory system: Secondary | ICD-10-CM | POA: Diagnosis not present

## 2016-10-09 DIAGNOSIS — R072 Precordial pain: Secondary | ICD-10-CM

## 2016-10-09 DIAGNOSIS — Z87891 Personal history of nicotine dependence: Secondary | ICD-10-CM | POA: Diagnosis not present

## 2016-10-09 DIAGNOSIS — R079 Chest pain, unspecified: Secondary | ICD-10-CM

## 2016-10-09 DIAGNOSIS — I160 Hypertensive urgency: Secondary | ICD-10-CM | POA: Diagnosis not present

## 2016-10-09 DIAGNOSIS — R0789 Other chest pain: Secondary | ICD-10-CM | POA: Diagnosis not present

## 2016-10-09 DIAGNOSIS — I10 Essential (primary) hypertension: Secondary | ICD-10-CM | POA: Diagnosis not present

## 2016-10-09 HISTORY — DX: Chest pain, unspecified: R07.9

## 2016-10-09 LAB — COMPREHENSIVE METABOLIC PANEL
ALT: 25 U/L (ref 17–63)
ANION GAP: 5 (ref 5–15)
AST: 21 U/L (ref 15–41)
Albumin: 3.6 g/dL (ref 3.5–5.0)
Alkaline Phosphatase: 64 U/L (ref 38–126)
BUN: 8 mg/dL (ref 6–20)
CHLORIDE: 107 mmol/L (ref 101–111)
CO2: 25 mmol/L (ref 22–32)
CREATININE: 0.73 mg/dL (ref 0.61–1.24)
Calcium: 8.3 mg/dL — ABNORMAL LOW (ref 8.9–10.3)
Glucose, Bld: 130 mg/dL — ABNORMAL HIGH (ref 65–99)
Potassium: 3.2 mmol/L — ABNORMAL LOW (ref 3.5–5.1)
SODIUM: 137 mmol/L (ref 135–145)
Total Bilirubin: 0.6 mg/dL (ref 0.3–1.2)
Total Protein: 6.5 g/dL (ref 6.5–8.1)

## 2016-10-09 LAB — ECHOCARDIOGRAM COMPLETE
CHL CUP DOP CALC LVOT VTI: 22.5 cm
CHL CUP MV DEC (S): 222
E/e' ratio: 6.4
EWDT: 222 ms
FS: 29 % (ref 28–44)
Height: 66 in
IV/PV OW: 0.85
LA vol A4C: 47.4 ml
LADIAMINDEX: 1.67 cm/m2
LASIZE: 31 mm
LAVOL: 45.8 mL
LAVOLIN: 24.6 mL/m2
LDCA: 2.84 cm2
LEFT ATRIUM END SYS DIAM: 31 mm
LV E/e' medial: 6.4
LV E/e'average: 6.4
LV PW d: 10.1 mm — AB (ref 0.6–1.1)
LV TDI E'MEDIAL: 8.81
LVELAT: 13.4 cm/s
LVOT peak grad rest: 5 mmHg
LVOT peak vel: 108 cm/s
LVOTD: 19 mm
LVOTSV: 64 mL
Lateral S' vel: 12.3 cm/s
MV Peak grad: 3 mmHg
MV pk E vel: 85.7 m/s
MVPKAVEL: 66 m/s
TAPSE: 23.8 mm
TDI e' lateral: 13.4
Weight: 2684.8 oz

## 2016-10-09 LAB — CBC
HCT: 39.8 % (ref 39.0–52.0)
HEMOGLOBIN: 13.5 g/dL (ref 13.0–17.0)
MCH: 29.6 pg (ref 26.0–34.0)
MCHC: 33.9 g/dL (ref 30.0–36.0)
MCV: 87.3 fL (ref 78.0–100.0)
PLATELETS: 226 10*3/uL (ref 150–400)
RBC: 4.56 MIL/uL (ref 4.22–5.81)
RDW: 12.5 % (ref 11.5–15.5)
WBC: 6.4 10*3/uL (ref 4.0–10.5)

## 2016-10-09 LAB — LIPID PANEL
CHOLESTEROL: 191 mg/dL (ref 0–200)
HDL: 45 mg/dL (ref >40)
LDL Cholesterol: 98 mg/dL (ref 0–99)
TRIGLYCERIDES: 239 mg/dL — AB (ref <150)
Total CHOL/HDL Ratio: 4.2 RATIO
VLDL: 48 mg/dL — AB (ref 0–40)

## 2016-10-09 LAB — HIV ANTIBODY (ROUTINE TESTING W REFLEX): HIV Screen 4th Generation wRfx: NONREACTIVE

## 2016-10-09 LAB — SEDIMENTATION RATE: SED RATE: 1 mm/h (ref 0–16)

## 2016-10-09 LAB — D-DIMER, QUANTITATIVE: D-Dimer, Quant: 0.36 ug/mL-FEU (ref 0.00–0.50)

## 2016-10-09 MED ORDER — POTASSIUM CHLORIDE CRYS ER 20 MEQ PO TBCR
40.0000 meq | EXTENDED_RELEASE_TABLET | ORAL | Status: AC
  Administered 2016-10-09 (×2): 40 meq via ORAL
  Filled 2016-10-09 (×2): qty 2

## 2016-10-09 MED ORDER — METOPROLOL TARTRATE 25 MG PO TABS: 25.0000 mg | ORAL_TABLET | Freq: Two times a day (BID) | ORAL | 11 refills | Status: DC

## 2016-10-09 NOTE — Discharge Instructions (Signed)
Nonspecific Chest Pain Chest pain can be caused by many different conditions. There is always a chance that your pain could be related to something serious, such as a heart attack or a blood clot in your lungs. Chest pain can also be caused by conditions that are not life-threatening. If you have chest pain, it is very important to follow up with your health care provider. What are the causes? Causes of this condition include: Heartburn. Pneumonia or bronchitis. Anxiety or stress. Inflammation around your heart (pericarditis) or lung (pleuritis or pleurisy). A blood clot in your lung. A collapsed lung (pneumothorax). This can develop suddenly on its own (spontaneous pneumothorax) or from trauma to the chest. Shingles infection (varicella-zoster virus). Heart attack. Damage to the bones, muscles, and cartilage that make up your chest wall. This can include: Bruised bones due to injury. Strained muscles or cartilage due to frequent or repeated coughing or overwork. Fracture to one or more ribs. Sore cartilage due to inflammation (costochondritis). What increases the risk? Risk factors for this condition may include: Activities that increase your risk for trauma or injury to your chest. Respiratory infections or conditions that cause frequent coughing. Medical conditions or overeating that can cause heartburn. Heart disease or family history of heart disease. Conditions or health behaviors that increase your risk of developing a blood clot. Having had chicken pox (varicella zoster). What are the signs or symptoms? Chest pain can feel like: Burning or tingling on the surface of your chest or deep in your chest. Crushing, pressure, aching, or squeezing pain. Dull or sharp pain that is worse when you move, cough, or take a deep breath. Pain that is also felt in your back, neck, shoulder, or arm, or pain that spreads to any of these areas. Your chest pain may come and go, or it may stay  constant. How is this diagnosed? Lab tests or other studies may be needed to find the cause of your pain. Your health care provider may have you take a test called an ECG (electrocardiogram). An ECG records your heartbeat patterns at the time the test is performed. You may also have other tests, such as: Transthoracic echocardiogram (TTE). In this test, sound waves are used to create a picture of the heart structures and to look at how blood flows through your heart. Transesophageal echocardiogram (TEE).This is a more advanced imaging test that takes images from inside your body. It allows your health care provider to see your heart in finer detail. Cardiac monitoring. This allows your health care provider to monitor your heart rate and rhythm in real time. Holter monitor. This is a portable device that records your heartbeat and can help to diagnose abnormal heartbeats. It allows your health care provider to track your heart activity for several days, if needed. Stress tests. These can be done through exercise or by taking medicine that makes your heart beat more quickly. Blood tests. Other imaging tests. How is this treated? Treatment depends on what is causing your chest pain. Treatment may include: Medicines. These may include: Acid blockers for heartburn. Anti-inflammatory medicine. Pain medicine for inflammatory conditions. Antibiotic medicine, if an infection is present. Medicines to dissolve blood clots. Medicines to treat coronary artery disease (CAD). Supportive care for conditions that do not require medicines. This may include: Resting. Applying heat or cold packs to injured areas. Limiting activities until pain decreases. Follow these instructions at home: Medicines  If you were prescribed an antibiotic, take it as told by your health care  provider. Do not stop taking the antibiotic even if you start to feel better. Take over-the-counter and prescription medicines only as told  by your health care provider. Lifestyle  Do not use any products that contain nicotine or tobacco, such as cigarettes and e-cigarettes. If you need help quitting, ask your health care provider. Do not drink alcohol. Make lifestyle changes as directed by your health care provider. These may include: Getting regular exercise. Ask your health care provider to suggest some activities that are safe for you. Eating a heart-healthy diet. A registered dietitian can help you to learn healthy eating options. Maintaining a healthy weight. Managing diabetes, if necessary. Reducing stress, such as with yoga or relaxation techniques. General instructions  Avoid any activities that bring on chest pain. If heartburn is the cause for your chest pain, raise (elevate) the head of your bed about 6 inches (15 cm) by putting blocks under the legs. Sleeping with more pillows does not effectively relieve heartburn because it only changes the position of your head. Keep all follow-up visits as told by your health care provider. This is important. This includes any further testing if your chest pain does not go away. Contact a health care provider if: Your chest pain does not go away. You have a rash with blisters on your chest. You have a fever. You have chills. Get help right away if: Your chest pain is worse. You have a cough that gets worse, or you cough up blood. You have severe pain in your abdomen. You have severe weakness. You faint. You have sudden, unexplained chest discomfort. You have sudden, unexplained discomfort in your arms, back, neck, or jaw. You have shortness of breath at any time. You suddenly start to sweat, or your skin gets clammy. You feel nauseous or you vomit. You suddenly feel light-headed or dizzy. Your heart begins to beat quickly, or it feels like it is skipping beats. These symptoms may represent a serious problem that is an emergency. Do not wait to see if the symptoms will go  away. Get medical help right away. Call your local emergency services (911 in the [place] Do not drive yourself to the hospital. This information is not intended to replace advice given to you by your health care provider. Make sure you discuss any questions you have with your health care provider. Document Released: 03/14/2005 Document Revised: 02/27/2016 Document Reviewed: 02/27/2016 Elsevier Interactive Patient Education  2017 [employer]. PLEASE REMEMBER TO BRING ALL OF YOUR MEDICATIONS TO EACH OF YOUR FOLLOW-UP OFFICE VISITS.  PLEASE ATTEND ALL SCHEDULED FOLLOW-UP APPOINTMENTS.   Activity: Increase activity slowly as tolerated. You may shower, but no soaking baths (or swimming) for 1 week. No driving for 2 days. No lifting over 5 lbs for 1 week. No sexual activity for 1 week.   You May Return to Work: in 1 week (if applicable)  Wound Care: You may wash cath site gently with soap and water. Keep cath site clean and dry. If you notice pain, swelling, bleeding or pus at your cath site, please call [phone].    Cardiac Cath Site Care Refer to this sheet in the next few weeks. These instructions provide you with information on caring for yourself after your procedure. Your caregiver may also give you more specific instructions. Your treatment has been planned according to current medical practices, but problems sometimes occur. Call your caregiver if you have any problems or questions after your procedure. HOME CARE INSTRUCTIONS  You may shower 24  hours after the procedure. Remove the bandage (dressing) and gently wash the site with plain soap and water. Gently pat the site dry.   Do not apply powder or lotion to the site.   Do not sit in a bathtub, swimming pool, or whirlpool for 5 to 7 days.   No bending, squatting, or lifting anything over 10 pounds (4.5 kg) as directed by your caregiver.   Inspect the site at least twice daily.   Do not drive home if you are discharged the same  day of the procedure. Have someone else drive you.   You may drive 24 hours after the procedure unless otherwise instructed by your caregiver.  What to expect:  Any bruising will usually fade within 1 to 2 weeks.   Blood that collects in the tissue (hematoma) may be painful to the touch. It should usually decrease in size and tenderness within 1 to 2 weeks.  SEEK IMMEDIATE MEDICAL CARE IF:  You have unusual pain at the site or down the affected limb.   You have redness, warmth, swelling, or pain at the site.   You have drainage (other than a small amount of blood on the dressing).   You have chills.   You have a fever or persistent symptoms for more than 72 hours.   You have a fever and your symptoms suddenly get worse.   Your leg becomes pale, cool, tingly, or numb.   You have heavy bleeding from the site. Hold pressure on the site.  Document Released: 07/07/2010 Document Revised: 05/24/2011 Document Reviewed: 07/07/2010 [facility] Patient Information 2012 [place], [place].

## 2016-10-09 NOTE — Discharge Summary (Signed)
Discharge Summary    Patient ID: Kevin Navarro,  MRN: [id number], DOB/AGE: Oct 17, 1975 41 y.o.  Admit date: 10/08/2016 Discharge date: 10/09/2016  Primary Care Provider: No PCP Per Patient Primary Cardiologist: Dr Clifton James  Discharge Diagnoses    Active Problems:   Precordial pain   Chest pain of uncertain etiology   Chest pain with moderate risk for cardiac etiology   Allergies Allergies  Allergen Reactions  . Hydrocodone Nausea And Vomiting    Diagnostic Studies/Procedures    ECHO: 10/09/2016 - Left ventricle: The cavity size was normal. There was mild focal   basal hypertrophy of the septum. Systolic function was normal.   The estimated ejection fraction was in the range of 55% to 60%.   Wall motion was normal; there were no regional wall motion   abnormalities. Left ventricular diastolic function parameters   were normal. Impressions: - Normal LV systolic and diastolic function; trace Kevin and TR.  CATH: 10/08/2016  The left ventricular systolic function is normal.  LV end diastolic pressure is normal.  The left ventricular ejection fraction is greater than 65% by visual estimate.  There is no mitral valve regurgitation.  1. No angiographic evidence of CAD 2. Myocardial bridging noted in the mid to distal LAD 3. Normal LV systolic function 4. No evidence of ascending aortic dissection Recommendations: Will monitor on telemetry tonight. Echo in am to exclude pericardial effusion and structural heart disease.   _____________   History of Present Illness      41 yo male with history of HTN with chest pressure for several days presented to the [facility] tonight. EKG with ST elevation in lead V2. Pt admitted for further evaluation and treatment.   Hospital Course     Consultants: None   Pt had an emergent cath because of chest pain and abnormal ECG. Results are above, he had no CAD, some myocardial bridging in LAD but not  significant. Echo recommended to r/o pericarditis and pericardial effusion.  His chest pain resolved. He had no further episodes.   His echo resulted, no abnormality in his EF, no effusion and no thickening noted in the pericardium.   His pain had been consistent in the setting of hypertensive urgency. BP improved on BB. Potassium slightly low and repleted. It is not clear why his K+ was low, not on diuretics pta. Dr Rennis Golden recommends he get a f/u BMET as an outpatient.   His lipid profile is acceptable. His glucose was elevated, a HgbA1c was drawn, but is not complete at discharge.  On 04/24, Kevin Navarro was ambulating without chest pain or SOB. His echo was ok. No further inpatient workup was indicated, he is considered stable for discharge, to follow up as an outpatient.   _____________  Discharge Vitals Blood pressure 132/83, pulse 60, temperature 98.2 F (36.8 C), temperature source Oral, resp. rate 18, height  (1.676 m), weight 167 lb 12.8 oz (76.1 kg), SpO2 98 %.  Filed Weights   10/08/16 2228 10/09/16 0549  Weight: 165 lb 8 oz (75.1 kg) 167 lb 12.8 oz (76.1 kg)    Labs & Radiologic Studies    CBC  Recent Labs  10/09/16 0012  WBC 6.4  HGB 13.5  HCT 39.8  MCV 87.3  PLT 226   Basic Metabolic Panel  Recent Labs  10/09/16 0012  NA 137  K 3.2*  CL 107  CO2 25  GLUCOSE 130*  BUN 8  CREATININE 0.73  CALCIUM 8.3*  Liver Function Tests  Recent Labs  10/09/16 0012  AST 21  ALT 25  ALKPHOS 64  BILITOT 0.6  PROT 6.5  ALBUMIN 3.6   D-Dimer  Recent Labs  10/09/16 0012  DDIMER 0.36   Hemoglobin A1C No results for input(s): HGBA1C in the last 72 hours. Fasting Lipid Panel  Recent Labs  10/09/16 1101  CHOL 191  HDL 45  LDLCALC 98  TRIG 239*  CHOLHDL 4.2    Lab Results  Component Value Date   ESRSEDRATE 1 10/09/2016   Disposition   Pt is being discharged home today in good condition.  Follow-up Plans & Appointments    Follow-up  Information    Laurann Montana, PA-C Follow up on 10/15/2016.   Specialties:  Cardiology, Radiology Contact information: [address] [place] [address] [phone]          Discharge Instructions    Diet - low sodium heart healthy    Complete by:  As directed    Increase activity slowly    Complete by:  As directed       Discharge Medications   Current Discharge Medication List    START taking these medications   Details  metoprolol tartrate (LOPRESSOR) 25 MG tablet Take 1 tablet (25 mg total) by mouth 2 (two) times daily. Qty: 60 tablet, Refills: 11      CONTINUE these medications which have NOT CHANGED   Details  fluticasone (FLONASE) 50 MCG/ACT nasal spray Place 2 sprays into both nostrils daily as needed for allergies or rhinitis.          Outstanding Labs/Studies   HgbA1c  Duration of Discharge Encounter   Greater than 30 minutes including physician time.  Melida Quitter NP 10/09/2016, 5:17 PM

## 2016-10-09 NOTE — Progress Notes (Addendum)
DAILY PROGRESS NOTE   Patient Name: Kevin Navarro Date of Encounter: 10/09/2016  Hospital Problem List   Active Problems:   Precordial pain   Chest pain of uncertain etiology    Chief Complaint   Chest pressure is better today  Subjective   No further pain overnight. Underwent LHC emergently last night with normal coronaries, except a bridging segment in the mid-LAD. Wife reports his BP was very high at 190/110 yesterday - he has had intermittent headaches with associated hypertension, but is not on medication and does not see a doctor. He reports some improvement with sitting up. Plan for echo today to r/o pericardial effusion - symptoms may be c/w pericarditis.   Objective   Vitals:   10/09/16 0009 10/09/16 0424 10/09/16 0509 10/09/16 0549  BP: 131/87 (!) 137/93 118/80 (!) 143/80  Pulse: (!) 57 (!) 55 (!) 53 (!) 52  Resp: '11 13 12 16  ' Temp:    97.4 F (36.3 C)  TempSrc:    Oral  SpO2: 95% 97% 96% 97%  Weight:    167 lb 12.8 oz (76.1 kg)  Height:        Intake/Output Summary (Last 24 hours) at 10/09/16 1033 Last data filed at 10/09/16 0506  Gross per 24 hour  Intake              710 ml  Output                0 ml  Net              710 ml   Filed Weights   10/08/16 2228 10/09/16 0549  Weight: 165 lb 8 oz (75.1 kg) 167 lb 12.8 oz (76.1 kg)    Physical Exam   General appearance: alert and no distress Lungs: clear to auscultation bilaterally Heart: regular rate and rhythm Extremities: extremities normal, atraumatic, no cyanosis or edema and right radial cath site without bruit or hematoma Neurologic: Grossly normal  Inpatient Medications    Scheduled Meds: . metoprolol tartrate  25 mg Oral BID  . sodium chloride flush  3 mL Intravenous Q12H    Continuous Infusions: . sodium chloride      PRN Meds: sodium chloride, acetaminophen, ondansetron (ZOFRAN) IV, sodium chloride flush   Labs   Results for orders placed or performed during the  hospital encounter of 10/08/16 (from the past 48 hour(s))  Comprehensive metabolic panel     Status: Abnormal   Collection Time: 10/09/16 12:12 AM  Result Value Ref Range   Sodium 137 135 - 145 mmol/L   Potassium 3.2 (L) 3.5 - 5.1 mmol/L   Chloride 107 101 - 111 mmol/L   CO2 25 22 - 32 mmol/L   Glucose, Bld 130 (H) 65 - 99 mg/dL   BUN 8 6 - 20 mg/dL   Creatinine, Ser 0.73 0.61 - 1.24 mg/dL   Calcium 8.3 (L) 8.9 - 10.3 mg/dL   Total Protein 6.5 6.5 - 8.1 g/dL   Albumin 3.6 3.5 - 5.0 g/dL   AST 21 15 - 41 U/L   ALT 25 17 - 63 U/L   Alkaline Phosphatase 64 38 - 126 U/L   Total Bilirubin 0.6 0.3 - 1.2 mg/dL   GFR calc non Af Amer >60 >60 mL/min   GFR calc Af Amer >60 >60 mL/min    Comment: (NOTE) The eGFR has been calculated using the CKD EPI equation. This calculation has not been validated in all clinical situations. eGFR's persistently <  60 mL/min signify possible Chronic Kidney Disease.    Anion gap 5 5 - 15  CBC     Status: None   Collection Time: 10/09/16 12:12 AM  Result Value Ref Range   WBC 6.4 4.0 - 10.5 K/uL   RBC 4.56 4.22 - 5.81 MIL/uL   Hemoglobin 13.5 13.0 - 17.0 g/dL   HCT 39.8 39.0 - 52.0 %   MCV 87.3 78.0 - 100.0 fL   MCH 29.6 26.0 - 34.0 pg   MCHC 33.9 30.0 - 36.0 g/dL   RDW 12.5 11.5 - 15.5 %   Platelets 226 150 - 400 K/uL  D-dimer, quantitative (not at [facility])     Status: None   Collection Time: 10/09/16 12:12 AM  Result Value Ref Range   D-Dimer, Quant 0.36 0.00 - 0.50 ug/mL-FEU    Comment: (NOTE) At the manufacturer cut-off of 0.50 ug/mL FEU, this assay has been documented to exclude PE with a sensitivity and negative predictive value of 97 to 99%.  At this time, this assay has not been approved by the FDA to exclude DVT/VTE. Results should be correlated with clinical presentation.     ECG   Sinus bradycardia, no ST elevation - Personally Reviewed  Telemetry   Sinus bradycardia - Personally Reviewed  Radiology   No results found.  Cardiac  Studies   Procedures   Left Heart Cath and Coronary Angiography  Conclusion     The left ventricular systolic function is normal.  LV end diastolic pressure is normal.  The left ventricular ejection fraction is greater than 65% by visual estimate.  There is no mitral valve regurgitation.   1. No angiographic evidence of CAD 2. Myocardial bridging noted in the mid to distal LAD 3. Normal LV systolic function 4. No evidence of ascending aortic dissection  Recommendations: Will monitor on telemetry tonight. Echo in am to exclude pericardial effusion and structural heart disease.     Assessment   1. Active Problems: 2.   Precordial pain 3.   Chest pain of uncertain etiology 4. ?Hypertensive urgency versus pericarditis  Plan   1. Mr. Granada had chest pressure which was constant yesterday in the setting of hypertensive urgency. He was found to have normal coronaries, except for a mid-LAD bridging segment. He was placed on metoprolol 25 mg BID and BP improved today. Plan for echo today to evaluate for pericardial effusion. Will replete potassium for K of 3.2 today. Add ESR to am labs. AM glucose was 130 - check HBA1c, may be new onset diabetic. Also check fasting lipid profile. Anticipate if echo is unremarkable that he will be able to be discharged later today.  Time Spent Directly with Patient:  15 minutes  Length of Stay:  LOS: 1 day   Pixie Casino, MD, [place]  Attending Cardiologist  Direct Dial: [phone]  Fax: [phone]  Website:  [email or web address]  Nadean Corwin Hilty 10/09/2016, 10:33 AM

## 2016-10-09 NOTE — Progress Notes (Signed)
  Echocardiogram 2D Echocardiogram has been performed.  Delcie Roch 10/09/2016, 3:04 PM

## 2016-10-10 LAB — HEMOGLOBIN A1C
Hgb A1c MFr Bld: 5.4 % (ref 4.8–5.6)
Mean Plasma Glucose: 108 mg/dL

## 2016-10-10 LAB — I-STAT CHEM 8, ED
BUN: 10 mg/dL (ref 6–20)
Calcium, Ion: 1.13 mmol/L — ABNORMAL LOW (ref 1.15–1.40)
Chloride: 103 mmol/L (ref 101–111)
Creatinine, Ser: 0.5 mg/dL — ABNORMAL LOW (ref 0.61–1.24)
GLUCOSE: 147 mg/dL — AB (ref 65–99)
HCT: 41 % (ref 39.0–52.0)
HEMOGLOBIN: 13.9 g/dL (ref 13.0–17.0)
Potassium: 3.3 mmol/L — ABNORMAL LOW (ref 3.5–5.1)
Sodium: 143 mmol/L (ref 135–145)
TCO2: 24 mmol/L (ref 0–100)

## 2016-10-11 ENCOUNTER — Encounter (HOSPITAL_COMMUNITY): Payer: Self-pay | Admitting: Cardiovascular Disease

## 2016-10-15 ENCOUNTER — Ambulatory Visit (INDEPENDENT_AMBULATORY_CARE_PROVIDER_SITE_OTHER): Payer: Commercial Managed Care - HMO | Admitting: Physician Assistant

## 2016-10-15 ENCOUNTER — Telehealth: Payer: Self-pay | Admitting: Physician Assistant

## 2016-10-15 ENCOUNTER — Encounter: Payer: Self-pay | Admitting: Physician Assistant

## 2016-10-15 VITALS — BP 132/84 | HR 68 | Ht 66.0 in | Wt 173.1 lb

## 2016-10-15 DIAGNOSIS — I10 Essential (primary) hypertension: Secondary | ICD-10-CM

## 2016-10-15 DIAGNOSIS — Q245 Malformation of coronary vessels: Secondary | ICD-10-CM

## 2016-10-15 DIAGNOSIS — E876 Hypokalemia: Secondary | ICD-10-CM | POA: Diagnosis not present

## 2016-10-15 DIAGNOSIS — R739 Hyperglycemia, unspecified: Secondary | ICD-10-CM | POA: Diagnosis not present

## 2016-10-15 DIAGNOSIS — R0789 Other chest pain: Secondary | ICD-10-CM | POA: Diagnosis not present

## 2016-10-15 DIAGNOSIS — Z9889 Other specified postprocedural states: Secondary | ICD-10-CM | POA: Insufficient documentation

## 2016-10-15 MED ORDER — OMEPRAZOLE 20 MG PO CPDR: 20.0000 mg | DELAYED_RELEASE_CAPSULE | Freq: Every day | ORAL | Status: DC

## 2016-10-15 MED ORDER — METOPROLOL SUCCINATE ER 50 MG PO TB24: 50.0000 mg | ORAL_TABLET | Freq: Every day | ORAL | 3 refills | Status: DC

## 2016-10-15 NOTE — Patient Instructions (Signed)
Medication Instructions:  Your physician has recommended you make the following change in your medication:  1) STOP Metoprolol Tartrate 2) START Metoprolol Succinate  daily. An Rx has been sent to your pharmacy 3) START Omeprazole (Prilosec)  daily. This can be purchased over the counter  Labwork: None ordered  Testing/Procedures: None ordered  Follow-Up: Your physician wants you to follow-up in: 1 year with Dr.Mcalhany  You will receive a reminder letter in the mail two months in advance. If you don't receive a letter, please call our office to schedule the follow-up appointment.   Any Other Special Instructions Will Be Listed Below (If Applicable).     If you need a refill on your cardiac medications before your next appointment, please call your pharmacy.

## 2016-10-15 NOTE — Telephone Encounter (Signed)
Please call patient. I wanted to mention that upon review of his hospital labs, his potassium was also borderline low at 3.3. They gave him potassium in the hospital.  Previous potassium was normal in 2017. We will need to follow this up to make sure he does not require a regular supplement. Please ask him to increase dietary intake of healthy sources of dietary intake of potassium including bananas, squash, yogurt, white beans, sweet potatoes, leafy greens, and avocados over the next 2 days with a recheck BMET Wed or Thurs. Thank you! Arilla Hice PA-C

## 2016-10-15 NOTE — Progress Notes (Signed)
Cardiology Office Note    Date:  10/15/2016  ID:  Kevin Navarro, DOB 1976-01-03, MRN [id number] PCP:  No PCP Per Patient  Cardiologist:  Angelena Form   Chief Complaint: chest pressure  History of Present Illness:  Kevin Navarro is a 41 y.o. male with history of HTN, hyperglycemia recent admission for chest pain who presents for post-cath follow-up.  He was recently admitted for chest pain and elevated blood pressure. EKG showed ST elevation in lead V2 prompting urgent cath. This was done 10/08/16 showing no evidence of CAD, myocardial bridging noted in the mid to distal LAD, normal LV function EF >65%, no evidence of ascending aortic dissection. 2D echo 10/09/16 showed mild focal basal hypertrophy of the septum, EF 55-60%, no RWMA, normal diastolic parameters, trace MR and TR. His presentaton was felt due to hypertensive urgency. Of note his labs showed K of 3.2-3.3 and glucose of 130-147. CBC wnl, A1C 5.4, LDL 98, HDL 45, trig 239, ESR 1, d-dimer negative.  He returns for follow-up with his wife. He continues to have frequent chest pressure without particular pattern, occurred after a meal in particular today. He also notes some exertional shortness of breath but states he's not sure if this is because he's out of shape and doing more. He has not been taking his metoprolol reliably - once a day if that. No dizziness, syncope, or lower extremity edema.   Past Medical History:  Diagnosis Date  . H/O cardiac catheterization    a. done 10/08/16 showing no evidence of CAD, myocardial bridging noted in the mid to distal LAD, normal LV function EF >65%,   . HTN (hypertension)   . Hyperglycemia   . Hypokalemia   . Myocardial bridge     Past Surgical History:  Procedure Laterality Date  . KNEE ARTHROSCOPY WITH MEDIAL MENISECTOMY Left 06/22/2016   Procedure: LEFT KNEE ARTHROSCOPY WITH PARTIAL MEDIAL MENISCECTOMY;  Surgeon: Mcarthur Rossetti, MD;  Location: [facility];  Service:  Orthopedics;  Laterality: Left;  . LEFT HEART CATH AND CORONARY ANGIOGRAPHY N/A 10/08/2016   Procedure: Left Heart Cath and Coronary Angiography;  Surgeon: Burnell Blanks, MD;  Location: [place] [facility];  Service: Cardiovascular;  Laterality: N/A;  . None      Current Medications: Current Outpatient Prescriptions  Medication Sig Dispense Refill  . metoprolol tartrate (LOPRESSOR) 25 MG tablet Take 1 tablet (25 mg total) by mouth 2 (two) times daily. 60 tablet 11  . Multiple Vitamin (MULTIVITAMIN WITH MINERALS) TABS tablet Take 1 tablet by mouth daily.     No current facility-administered medications for this visit.      Allergies:   Bee venom and Hydrocodone   Social History   Social History  . Marital status: Single    Spouse name: N/A  . Number of children: N/A  . Years of education: N/A   Occupational History  . Nature conservation officer    Social History Main Topics  . Smoking status: Former Smoker    Packs/day: 1.00    Years: 10.00    Types: Cigarettes    Quit date: 06/19/2011  . Smokeless tobacco: Never Used  . Alcohol use 3.0 oz/week    5 Cans of beer per week     Comment: 1 beer a week  . Drug use: No  . Sexual activity: Yes    Birth control/ protection: Condom, Coitus interruptus   Other Topics Concern  . None   Social History Narrative   ** Merged History Encounter **  Family History:  Family History  Problem Relation Age of Onset  . Hypertension Mother     ROS:   Please see the history of present illness.  All other systems are reviewed and otherwise negative.    PHYSICAL EXAM:   VS:  BP 132/84   Pulse 68   Ht 5' 6" (1.676 m)   Wt 173 lb 1.9 oz (78.5 kg)   BMI 27.94 kg/m   BMI: Body mass index is 27.94 kg/m. GEN: Well nourished, well developed Hispanic M, in no acute distress  HEENT: normocephalic, atraumatic Neck: no JVD, carotid bruits, or masses Cardiac: RRR; no murmurs, rubs, or gallops, no edema  Respiratory:  clear to  auscultation bilaterally, normal work of breathing GI: soft, nontender, nondistended, + BS MS: no deformity or atrophy  Skin: warm and dry, no rash. Right radial cath site without hematoma or ecchymosis; good pulse. Neuro:  Alert and Oriented x 3, Strength and sensation are intact, follows commands Psych: euthymic mood, full affect Ambulatory pulse ox 96% throughout  Wt Readings from Last 3 Encounters:  10/15/16 173 lb 1.9 oz (78.5 kg)  10/09/16 167 lb 12.8 oz (76.1 kg)  10/08/16 172 lb (78 kg)      Studies/Labs Reviewed:   EKG:  EKG was ordered today and personally reviewed by me and demonstrates NSR 70bpm, LPFB, no acute St-T changes, similar to prior. J point elevation in V1-V2.  Recent Labs: 10/09/2016: ALT 25; BUN 8; Creatinine, Ser 0.73; Hemoglobin 13.5; Platelets 226; Potassium 3.2; Sodium 137   Lipid Panel    Component Value Date/Time   CHOL 191 10/09/2016 1101   TRIG 239 (H) 10/09/2016 1101   HDL 45 10/09/2016 1101   CHOLHDL 4.2 10/09/2016 1101   VLDL 48 (H) 10/09/2016 1101   LDLCALC 98 10/09/2016 1101    Additional studies/ records that were reviewed today include: Summarized above.    ASSESSMENT & PLAN:   1. Chest pressure - persistent, atypical. No acute EKG changes. Blood pressure control improved but has not been fully compliant with beta blocker medication. Will change metoprolol tartrate to once-daily succinate for improved compliance. Will also try a trial of omeprazole in case his residual symptoms represent GERD. I informed him of the need to obtain PCP to discuss further workup of noncardiac chest pain/dyspnea is symptoms persist. The patient also hopes to become more physically active which should help endurance. 2. Essential HTN - change metoprolol to succinate (38m daily) for improved compliance.  3. Myocardial bridge  - reviewed finding from recent cath. From Dr. MCamillia Herternote it does not seem as though he felt this was particularly significant.  Reviewed importance of general healthy lifestyle changes. 4. Hyperglycemia - importance of following up with primary care provider reinforced.  5. Hypokalemia - no clear etiology, s/p repletion in the hospital. See phone note - reviewed further after patient left clinic. Nurse has called patient with my request to ask patient to increase potassium intake over the next few days and recheck BMET later this week.   Disposition: F/u with Dr. MAngelena Formin 1 year.   Medication Adjustments/Labs and Tests Ordered: Current medicines are reviewed at length with the patient today.  Concerns regarding medicines are outlined above. Medication changes, Labs and Tests ordered today are summarized above and listed in the Patient Instructions accessible in Encounters.   SRaechel AchePA-C  10/15/2016 4:04 PM    [facility] [address] [place]  [id number]: [phone]  [phone]; Fax: [phone]

## 2016-10-23 NOTE — Telephone Encounter (Signed)
I saw you left message for patient, can you reach out one more time? Thanks! Markeisha Mancias PA-C

## 2016-10-23 NOTE — Telephone Encounter (Signed)
Spoke to pt and advised him of the message below re: K+. Pt advised to increase dietary intake of potassium rich foods and we will recheck bmet 10/26/16.  Pt verbalized understanding.

## 2016-10-26 ENCOUNTER — Other Ambulatory Visit: Payer: Commercial Managed Care - HMO

## 2017-11-14 ENCOUNTER — Other Ambulatory Visit: Payer: Self-pay | Admitting: Physician Assistant

## 2017-11-21 ENCOUNTER — Emergency Department (HOSPITAL_COMMUNITY)
Admission: EM | Admit: 2017-11-21 | Discharge: 2017-11-22 | Disposition: A | Discharge status: Home / Self Care | Payer: 59 | Attending: Emergency Medicine | Admitting: Emergency Medicine

## 2017-11-21 ENCOUNTER — Other Ambulatory Visit: Payer: Self-pay

## 2017-11-21 ENCOUNTER — Encounter (HOSPITAL_COMMUNITY): Payer: Self-pay | Admitting: Emergency Medicine

## 2017-11-21 DIAGNOSIS — R42 Dizziness and giddiness: Secondary | ICD-10-CM | POA: Insufficient documentation

## 2017-11-21 DIAGNOSIS — I10 Essential (primary) hypertension: Secondary | ICD-10-CM | POA: Insufficient documentation

## 2017-11-21 DIAGNOSIS — Z79899 Other long term (current) drug therapy: Secondary | ICD-10-CM | POA: Diagnosis not present

## 2017-11-21 DIAGNOSIS — R110 Nausea: Secondary | ICD-10-CM | POA: Diagnosis present

## 2017-11-21 DIAGNOSIS — Z87891 Personal history of nicotine dependence: Secondary | ICD-10-CM | POA: Diagnosis not present

## 2017-11-21 DIAGNOSIS — R55 Syncope and collapse: Secondary | ICD-10-CM | POA: Diagnosis not present

## 2017-11-21 LAB — CBC
HEMATOCRIT: 41.1 % (ref 39.0–52.0)
HEMOGLOBIN: 13.6 g/dL (ref 13.0–17.0)
MCH: 29.4 pg (ref 26.0–34.0)
MCHC: 33.1 g/dL (ref 30.0–36.0)
MCV: 88.8 fL (ref 78.0–100.0)
Platelets: 261 10*3/uL (ref 150–400)
RBC: 4.63 MIL/uL (ref 4.22–5.81)
RDW: 12.1 % (ref 11.5–15.5)
WBC: 6.9 10*3/uL (ref 4.0–10.5)

## 2017-11-21 LAB — BASIC METABOLIC PANEL
ANION GAP: 7 (ref 5–15)
BUN: 15 mg/dL (ref 6–20)
CO2: 26 mmol/L (ref 22–32)
Calcium: 8.7 mg/dL — ABNORMAL LOW (ref 8.9–10.3)
Chloride: 106 mmol/L (ref 101–111)
Creatinine, Ser: 0.81 mg/dL (ref 0.61–1.24)
GLUCOSE: 102 mg/dL — AB (ref 65–99)
POTASSIUM: 3.5 mmol/L (ref 3.5–5.1)
Sodium: 139 mmol/L (ref 135–145)

## 2017-11-21 NOTE — ED Notes (Signed)
ED Provider at bedside. 

## 2017-11-21 NOTE — ED Provider Notes (Signed)
Patient placed in Quick Look pathway, seen and evaluated   Chief Complaint: Lightheadedness and nausea  HPI:   Patient presents to the ED for evaluation of intermittent nausea and dizziness which he describes as feeling lightheaded or like he is going to pass out over the past 5 to 6 days.  Symptoms are intermittent but reports they are coming more persistent.  Patient denies any syncope, he denies any room spinning sensation.  Does report having intermittent headaches.  He denies any vision changes.  No weakness, numbness or tingling in any of aches extremities.  No episodes of emesis, denies abdominal pain, chest pain or shortness of breath, no fevers.  Patient denies any pain at this time.  ROS: + Lightheadedness, nausea, -fevers, emesis, headache, vision changes, numbness, weakness, abdominal pain, chest pain, shortness of breath  Physical Exam:   Gen: No distress  Neuro: Awake and Alert  Skin: Warm    Focused Exam: Heart with regular rate and rhythm, lungs clear to auscultation, abdominal exam is benign, cranial nerves intact, patient moving all extremities without difficulty, sensation intact, no neurologic deficits noted.   Initiation of care has begun. The patient has been counseled on the process, plan, and necessity for staying for the completion/evaluation, and the remainder of the medical screening examination    Legrand RamsFord, Kelsey N, PA-C 11/21/17 2048    Gwyneth SproutPlunkett, Whitney, MD 11/23/17 2159

## 2017-11-21 NOTE — ED Notes (Signed)
Results reviewed, no changes in acuity at this time 

## 2017-11-21 NOTE — ED Triage Notes (Signed)
Patient reports intermittent nausea with mild dizziness this week , denies emesis or fever , no pain/respirations unlabored .

## 2017-11-22 ENCOUNTER — Emergency Department (HOSPITAL_COMMUNITY): Payer: 59

## 2017-11-22 LAB — I-STAT TROPONIN, ED: TROPONIN I, POC: 0 ng/mL (ref 0.00–0.08)

## 2017-11-22 LAB — TSH: TSH: 5.288 u[IU]/mL — AB (ref 0.350–4.500)

## 2017-11-22 LAB — D-DIMER, QUANTITATIVE: D-Dimer, Quant: 0.31 ug/mL-FEU (ref 0.00–0.50)

## 2017-11-22 MED ORDER — SODIUM CHLORIDE 0.9 % IV BOLUS
1000.0000 mL | Freq: Once | INTRAVENOUS | Status: AC
  Administered 2017-11-22: 1000 mL via INTRAVENOUS

## 2017-11-22 NOTE — ED Provider Notes (Signed)
[facility] Provider Note   CSN: [id number] Arrival date & time: 11/21/17  1901     History   Chief Complaint Chief Complaint  Patient presents with  . Nausea    HPI Kevin Navarro is a 42 y.o. male.  This patient is a 42 year old male with past medical history of hypertension presenting with multiple complaints that have been occurring for the past week.  He reports headaches, dizziness, feeling flushed and pale.  He has felt as though he was going to "pass out" on several occasions.  He has had a low potassium once before that has caused similar symptoms.  He denies any fevers or chills.  He does report intermittent chest discomfort that occurs randomly.    The history is provided by the patient.    Past Medical History:  Diagnosis Date  . H/O cardiac catheterization    a. done 10/08/16 showing no evidence of CAD, myocardial bridging noted in the mid to distal LAD, normal LV function EF >65%,   . HTN (hypertension)   . Hyperglycemia   . Hypokalemia   . Myocardial bridge     Patient Active Problem List   Diagnosis Date Noted  . Myocardial bridge   . Hypokalemia   . Hyperglycemia   . HTN (hypertension)   . H/O cardiac catheterization   . Chest pain with moderate risk for cardiac etiology 10/09/2016  . Chest pain of uncertain etiology 10/08/2016  . Precordial pain   . Status post arthroscopic surgery of left knee 07/07/2016  . Meniscus, medial, bucket handle tear, old 06/22/2016    Past Surgical History:  Procedure Laterality Date  . KNEE ARTHROSCOPY WITH MEDIAL MENISECTOMY Left 06/22/2016   Procedure: LEFT KNEE ARTHROSCOPY WITH PARTIAL MEDIAL MENISCECTOMY;  Surgeon: Kathryne Hitchhristopher Y Blackman, MD;  Location: [facility];  Service: Orthopedics;  Laterality: Left;  . LEFT HEART CATH AND CORONARY ANGIOGRAPHY N/A 10/08/2016   Procedure: Left Heart Cath and Coronary Angiography;  Surgeon: Kathleene Hazelhristopher D McAlhany, MD;  [facility];  Service: Cardiovascular;  Laterality: N/A;  . None          Home Medications    Prior to Admission medications   Medication Sig Start Date End Date Taking? Authorizing Provider  metoprolol succinate (TOPROL-XL) 50 MG 24 hr tablet Take 1 tablet by mouth daily with or immediately following a meal. Patient needs an appointment for further refills 1st attempt 11/15/17   Laurann Montanaunn, Dayna N, PA-C  Multiple Vitamin (MULTIVITAMIN WITH MINERALS) TABS tablet Take 1 tablet by mouth daily.    [provider]  omeprazole (PRILOSEC) 20 MG capsule Take 1 capsule (20 mg total) by mouth daily. 10/15/16   Laurann Montanaunn, Dayna N, PA-C    Family History Family History  Problem Relation Age of Onset  . Hypertension Mother     Social History Social History   Tobacco Use  . Smoking status: Former Smoker    Packs/day: 1.00    Years: 10.00    Pack years: 10.00    Types: Cigarettes    Last attempt to quit: 06/19/2011    Years since quitting: 6.4  . Smokeless tobacco: Never Used  Substance Use Topics  . Alcohol use: Yes    Alcohol/week: 3.0 oz    Types: 5 Cans of beer per week    Comment: 1 beer a week  . Drug use: No     Allergies   Bee venom and Hydrocodone   Review  of Systems Review of Systems  All other systems reviewed and are negative.    Physical Exam Updated Vital Signs BP 137/82   Pulse (!) 55   Temp 98.2 F (36.8 C) (Oral)   Resp 20   Ht 5\' 6"  (1.676 m)   Wt 79.4 kg (175 lb)   SpO2 96%   BMI 28.25 kg/m   Physical Exam  Constitutional: He is oriented to person, place, and time. He appears well-developed and well-nourished. No distress.  HENT:  Head: Normocephalic and atraumatic.  Mouth/Throat: Oropharynx is clear and moist.  Eyes: Pupils are equal, round, and reactive to light. EOM are normal.  Neck: Normal range of motion. Neck supple.  Cardiovascular: Normal rate and regular rhythm. Exam reveals no friction rub.  No murmur heard. Pulmonary/Chest:  Effort normal and breath sounds normal. No respiratory distress. He has no wheezes. He has no rales.  Abdominal: Soft. Bowel sounds are normal. He exhibits no distension. There is no tenderness.  Musculoskeletal: Normal range of motion. He exhibits no edema.  Neurological: He is alert and oriented to person, place, and time. No cranial nerve deficit. He exhibits normal muscle tone. Coordination normal.  Skin: Skin is warm and dry. He is not diaphoretic. No pallor.  Nursing note and vitals reviewed.    ED Treatments / Results  Labs (all labs ordered are listed, but only abnormal results are displayed) Labs Reviewed  BASIC METABOLIC PANEL - Abnormal; Notable for the following components:      Result Value   Glucose, Bld 102 (*)    Calcium 8.7 (*)    All other components within normal limits  CBC  D-DIMER, QUANTITATIVE (NOT AT [facility])  I-STAT TROPONIN, ED    ED ECG REPORT   Date: 11/22/2017  Rate: 104  Rhythm: sinus tachycardia  QRS Axis: normal  Intervals: normal  ST/T Wave abnormalities: normal  Conduction Disutrbances:none  Narrative Interpretation:   Old EKG Reviewed: unchanged  I have personally reviewed the EKG tracing and agree with the computerized printout as noted.   Radiology No results found.  Procedures Procedures (including critical care time)  Medications Ordered in ED Medications  sodium chloride 0.9 % bolus 1,000 mL (has no administration in time range)     Initial Impression / Assessment and Plan / ED Course  I have reviewed the triage vital signs and the nursing notes.  Pertinent labs & imaging results that were available during my care of the patient were reviewed by me and considered in my medical decision making (see chart for details).  Patient presents here with multiple complaints as described in the HPI.  His physical examination is unremarkable and work-up is basically normal.  He has no white count, no fever, normal electrolytes, normal  head CT, negative d-dimer, an unchanged EKG with negative troponin.  I am uncertain as to the exact etiology of his symptoms, however nothing tonight appears emergent.  He did have a very slight elevation of his TSH, the significance of which I am uncertain.  I have advised him to have this followed up with his primary doctor in the future.  Final Clinical Impressions(s) / ED Diagnoses   Final diagnoses:  None    ED Discharge Orders    None       Geoffery Lyons, MD 11/22/17 [phone]

## 2017-11-22 NOTE — Discharge Instructions (Addendum)
Drink plenty of fluids and get plenty of rest.  Follow up with your primary doctor if not improving in the next few days and return to the emergency department if symptoms significantly worsen or change.

## 2017-11-22 NOTE — ED Notes (Addendum)
Pt verbalizes understanding of d/c instructions. Pt ambulatory at d/c with all belongings and with family.   

## 2017-11-22 NOTE — ED Notes (Signed)
Patient transported to CT 

## 2017-12-14 ENCOUNTER — Other Ambulatory Visit: Payer: Self-pay | Admitting: Physician Assistant

## 2018-01-07 NOTE — Progress Notes (Signed)
Cardiology Office Note    Date:  01/08/2018   ID:  Kevin Navarro, DOB [birth year], MRN [id number]  PCP:  [facility]  Cardiologist: Verne Carrowhristopher McAlhany, MD  Chief Complaint  Patient presents with  . Follow-up    History of Present Illness:  Kevin Navarro is a 42 y.o. male with history of hypertension, abnormal EKG with ST elevation in lead V2 in the setting of hypertensive urgency prompting cardiac catheterization 10/08/2016 show no evidence of CAD with myocardial bridging noted in the mid to distal LAD normal LVEF greater than 65%, no evidence of ascending aortic dissection.  2D echo showed mild basal hypertrophy of the septum LVEF 55 to 60% trace of MR and TR.  Last saw Ronie SpiesDayna Dunn, PA-C 09/2016 which time he was doing well.  Has had hypokalemia in the past.  Patient was in the ED 11/22/2017 with dizziness and headaches and nausea.  He had a normal CT, normal electrolytes, negative d-dimer, negative troponin and EKG was unchanged.  He had a slightly elevated TSH 5.288 and told to follow-up with PCP.  Patient comes in today accompanied by his wife.  He admits to not taking his medications regularly but says his getting better and takes his metoprolol about 80% of the time.  This morning he chewed it up without water right before he walked into the office and his blood pressure was high.  It was also high at his PCPs.  He eats an extremely high sodium diet eating [place] just about every day.  His father had a stroke last year.  Past Medical History:  Diagnosis Date  . Chest pain of uncertain etiology 10/08/2016  . Chest pain with moderate risk for cardiac etiology 10/09/2016  . H/O cardiac catheterization    a. done 10/08/16 showing no evidence of CAD, myocardial bridging noted in the mid to distal LAD, normal LV function EF >65%,   . HTN (hypertension)   . Hyperglycemia   . Hypokalemia   . Meniscus, medial, bucket handle tear, old 06/22/2016  . Myocardial  bridge   . Precordial pain   . Status post arthroscopic surgery of left knee 07/07/2016    Past Surgical History:  Procedure Laterality Date  . KNEE ARTHROSCOPY WITH MEDIAL MENISECTOMY Left 06/22/2016   Procedure: LEFT KNEE ARTHROSCOPY WITH PARTIAL MEDIAL MENISCECTOMY;  Surgeon: Kathryne Hitchhristopher Y Blackman, MD;  Location: [facility];  Service: Orthopedics;  Laterality: Left;  . LEFT HEART CATH AND CORONARY ANGIOGRAPHY N/A 10/08/2016   Procedure: Left Heart Cath and Coronary Angiography;  Surgeon: Kathleene Hazelhristopher D McAlhany, MD;  [facility];  Service: Cardiovascular;  Laterality: N/A;  . None      Current Medications: Current Meds  Medication Sig  . metoprolol succinate (TOPROL-XL) 50 MG 24 hr tablet Take 1 tablet (50 mg total) by mouth daily. TAKE 1 TABLET BY MOUTH DAILY WITH OR IMMEDIATELY FOLLOWING A MEAL. NEEDS AN APPOINTMENT FOR FURTHER REFILLS  . Multiple Vitamin (MULTIVITAMIN WITH MINERALS) TABS tablet Take 1 tablet by mouth daily.  . [DISCONTINUED] omeprazole (PRILOSEC) 20 MG capsule Take 1 capsule (20 mg total) by mouth daily.     Allergies:   Bee venom and Hydrocodone   Social History   Socioeconomic History  . Marital status: Single    Spouse name: Not on file  . Number of children: Not on file  . Years of education: Not on file  . Highest education level: Not on file  Occupational History  . Occupation: Corporate investment bankerConstruction worker  Social Needs  . Financial resource strain: Not on file  . Food insecurity:    Worry: Not on file    Inability: Not on file  . Transportation needs:    Medical: Not on file    Non-medical: Not on file  Tobacco Use  . Smoking status: Former Smoker    Packs/day: 1.00    Years: 10.00    Pack years: 10.00    Types: Cigarettes    Last attempt to quit: 06/19/2011    Years since quitting: 6.5  . Smokeless tobacco: Never Used  Substance and Sexual Activity  . Alcohol use: Yes    Alcohol/week: 3.0 oz    Types: 5 Cans of beer per week     Comment: 1 beer a week  . Drug use: No  . Sexual activity: Yes    Birth control/protection: Condom, Coitus interruptus  Lifestyle  . Physical activity:    Days per week: Not on file    Minutes per session: Not on file  . Stress: Not on file  Relationships  . Social connections:    Talks on phone: Not on file    Gets together: Not on file    Attends religious service: Not on file    Active member of club or organization: Not on file    Attends meetings of clubs or organizations: Not on file    Relationship status: Not on file  Other Topics Concern  . Not on file  Social History Narrative   ** Merged History Encounter **         Family History:  The patient's family history includes CVA in his father; Hypertension in his mother.   ROS:   Please see the history of present illness.    Review of Systems  Constitution: Negative.  HENT: Negative.   Cardiovascular: Negative.   Respiratory: Negative.   Endocrine: Negative.   Hematologic/Lymphatic: Negative.   Musculoskeletal: Negative.   Gastrointestinal: Negative.   Genitourinary: Negative.   Neurological: Negative.    All other systems reviewed and are negative.   PHYSICAL EXAM:   VS:  BP 134/90   Pulse (!) 57   Ht 5\' 6"  (1.676 m)   Wt 169 lb (76.7 kg)   SpO2 96%   BMI 27.28 kg/m   Physical Exam  GEN: Well nourished, well developed, in no acute distress  Neck: no JVD, carotid bruits, or masses Cardiac:RRR; positive S4 no murmurs, rubs,  Respiratory:  clear to auscultation bilaterally, normal work of breathing GI: soft, nontender, nondistended, + BS Ext: without cyanosis, clubbing, or edema, Good distal pulses bilaterally Neuro:  Alert and Oriented x 3, Strength and sensation are intact Psych: euthymic mood, full affect  Wt Readings from Last 3 Encounters:  01/08/18 169 lb (76.7 kg)  11/21/17 175 lb (79.4 kg)  10/15/16 173 lb 1.9 oz (78.5 kg)      Studies/Labs Reviewed:   EKG:  EKG is not ordered today.   The ekg reviewed from the emergency room 11/22/2017 normal sinus rhythm with incomplete right bundle branch block recent Labs: 11/21/2017: BUN 15; Creatinine, Ser 0.81; Hemoglobin 13.6; Platelets 261; Potassium 3.5; Sodium 139 11/22/2017: TSH 5.288   Lipid Panel    Component Value Date/Time   CHOL 191 10/09/2016 1101   TRIG 239 (H) 10/09/2016 1101   HDL 45 10/09/2016 1101   CHOLHDL 4.2 10/09/2016 1101   VLDL 48 (H) 10/09/2016 1101   LDLCALC 98 10/09/2016 1101    Additional studies/ records that  were reviewed today include:  Echo 4/Study Conclusions   - Left ventricle: The cavity size was normal. There was mild focal   basal hypertrophy of the septum. Systolic function was normal.   The estimated ejection fraction was in the range of 55% to 60%.   Wall motion was normal; there were no regional wall motion   abnormalities. Left ventricular diastolic function parameters   were normal.   Impressions:   - Normal LV systolic and diastolic function; trace MR and TR.    Cardiac cath 4/23/2018The left ventricular systolic function is normal.  LV end diastolic pressure is normal.  The left ventricular ejection fraction is greater than 65% by visual estimate.  There is no mitral valve regurgitation.   1. No angiographic evidence of CAD 2. Myocardial bridging noted in the mid to distal LAD 3. Normal LV systolic function 4. No evidence of ascending aortic dissection   Recommendations: Will monitor on telemetry tonight. Echo in am to exclude pericardial effusion and structural heart disease.        ASSESSMENT:    1. Essential hypertension   2. Myocardial bridge      PLAN:  In order of problems listed above:   Essential hypertension blood pressure elevated when he came in but when I retook it it was down to 128/88.  Stressed the importance of taking his medications regularly.  His wife will take his blood pressure daily for 2 weeks and call us if it is over 135/85.  Come back  for the nurse to check his blood pressure in 1 month.  2 g sodium diet essential.  Follow-up with Dr. Clifton James in 1 year.  Myocardial bridging on cardiac catheterization with normal coronary arteries and no structural heart disease on 2D echo 09/2016 no chest pain.  Hyperlipidemia check fasting lipid panel today.  Medication Adjustments/Labs and Tests Ordered: Current medicines are reviewed at length with the patient today.  Concerns regarding medicines are outlined above.  Medication changes, Labs and Tests ordered today are listed in the Patient Instructions below. There are no Patient Instructions on file for this visit.   Elson Clan, PA-C  01/08/2018 9:32 AM    [facility] [address] [place], [place], [place]  [address] Phone: [phone]; Fax: [phone]

## 2018-01-08 ENCOUNTER — Encounter

## 2018-01-08 ENCOUNTER — Encounter: Payer: Self-pay | Admitting: Physician Assistant

## 2018-01-08 ENCOUNTER — Ambulatory Visit (INDEPENDENT_AMBULATORY_CARE_PROVIDER_SITE_OTHER): Payer: 59 | Admitting: Physician Assistant

## 2018-01-08 VITALS — BP 134/90 | HR 57 | Ht 66.0 in | Wt 169.0 lb

## 2018-01-08 DIAGNOSIS — Q245 Malformation of coronary vessels: Secondary | ICD-10-CM

## 2018-01-08 DIAGNOSIS — E785 Hyperlipidemia, unspecified: Secondary | ICD-10-CM | POA: Insufficient documentation

## 2018-01-08 DIAGNOSIS — I10 Essential (primary) hypertension: Secondary | ICD-10-CM | POA: Diagnosis not present

## 2018-01-08 DIAGNOSIS — E782 Mixed hyperlipidemia: Secondary | ICD-10-CM | POA: Diagnosis not present

## 2018-01-08 LAB — LIPID PANEL
CHOLESTEROL TOTAL: 175 mg/dL (ref 100–199)
Chol/HDL Ratio: 4.1 ratio (ref 0.0–5.0)
HDL: 43 mg/dL (ref >39)
LDL Calculated: 114 mg/dL — ABNORMAL HIGH (ref 0–99)
Triglycerides: 91 mg/dL (ref 0–149)
VLDL CHOLESTEROL CAL: 18 mg/dL (ref 5–40)

## 2018-01-08 MED ORDER — METOPROLOL SUCCINATE ER 50 MG PO TB24: 50.0000 mg | ORAL_TABLET | Freq: Every day | ORAL | 3 refills | Status: DC

## 2018-01-08 NOTE — Addendum Note (Signed)
Addended by: Michaelle CopasBAKER, Josean Lycan on: 01/08/2018 09:47 AM   Modules accepted: Orders

## 2018-01-08 NOTE — Patient Instructions (Signed)
Medication Instructions:  Your physician recommends that you continue on your current medications as directed. Please refer to the Current Medication list given to you today.  Labwork: TODAY: FASTING LIPIDS  Testing/Procedures: NONE ORDERED  Follow-Up: PLEASE COME BACK FOR A BLOOD PRESSURE CHECK IN 1 MONTH.  Any Other Special Instructions Will Be Listed Below (If Applicable).  MONITOR YOUR BLOOD PRESSURE FOR 2 WEEKS AND IF IT GETS ABOVE 135/85, PLEASE CALL OUR OFFICE TO MAKE [place] AWARE. [phone])   If you need a refill on your cardiac medications before your next appointment, please call your pharmacy.   DASH Eating Plan DASH stands for "Dietary Approaches to Stop Hypertension." The DASH eating plan is a healthy eating plan that has been shown to reduce high blood pressure (hypertension). It may also reduce your risk for type 2 diabetes, heart disease, and stroke. The DASH eating plan may also help with weight loss. What are tips for following this plan? General guidelines  Avoid eating more than 2,300 mg (milligrams) of salt (sodium) a day. If you have hypertension, you may need to reduce your sodium intake to 1,500 mg a day.  Limit alcohol intake to no more than 1 drink a day for nonpregnant women and 2 drinks a day for men. One drink equals 12 oz of beer, 5 oz of wine, or 1 oz of hard liquor.  Work with your health care provider to maintain a healthy body weight or to lose weight. Ask what an ideal weight is for you.  Get at least 30 minutes of exercise that causes your heart to beat faster (aerobic exercise) most days of the week. Activities may include walking, swimming, or biking.  Work with your health care provider or diet and nutrition specialist (dietitian) to adjust your eating plan to your individual calorie needs. Reading food labels  Check food labels for the amount of sodium per serving. Choose foods with less than 5 percent of the Daily Value of sodium.  Generally, foods with less than 300 mg of sodium per serving fit into this eating plan.  To find whole grains, look for the word "whole" as the first word in the ingredient list. Shopping  Buy products labeled as "low-sodium" or "no salt added."  Buy fresh foods. Avoid canned foods and premade or frozen meals. Cooking  Avoid adding salt when cooking. Use salt-free seasonings or herbs instead of table salt or sea salt. Check with your health care provider or pharmacist before using salt substitutes.  Do not fry foods. Cook foods using healthy methods such as baking, boiling, grilling, and broiling instead.  Cook with heart-healthy oils, such as olive, canola, soybean, or sunflower oil. Meal planning   Eat a balanced diet that includes: ? 5 or more servings of fruits and vegetables each day. At each meal, try to fill half of your plate with fruits and vegetables. ? Up to 6-8 servings of whole grains each day. ? Less than 6 oz of lean meat, poultry, or fish each day. A 3-oz serving of meat is about the same size as a deck of cards. One egg equals 1 oz. ? 2 servings of low-fat dairy each day. ? A serving of nuts, seeds, or beans 5 times each week. ? Heart-healthy fats. Healthy fats called Omega-3 fatty acids are found in foods such as flaxseeds and coldwater fish, like sardines, salmon, and mackerel.  Limit how much you eat of the following: ? Canned or prepackaged foods. ? Food that is high  in trans fat, such as fried foods. ? Food that is high in saturated fat, such as fatty meat. ? Sweets, desserts, sugary drinks, and other foods with added sugar. ? Full-fat dairy products.  Do not salt foods before eating.  Try to eat at least 2 vegetarian meals each week.  Eat more home-cooked food and less restaurant, buffet, and fast food.  When eating at a restaurant, ask that your food be prepared with less salt or no salt, if possible. What foods are recommended? The items listed may  not be a complete list. Talk with your dietitian about what dietary choices are best for you. Grains Whole-grain or whole-wheat bread. Whole-grain or whole-wheat pasta. Brown rice. [place]. Bulgur. Whole-grain and low-sodium cereals. Pita bread. Low-fat, low-sodium crackers. Whole-wheat flour tortillas. Vegetables Fresh or frozen vegetables (raw, steamed, roasted, or grilled). Low-sodium or reduced-sodium tomato and vegetable juice. Low-sodium or reduced-sodium tomato sauce and tomato paste. Low-sodium or reduced-sodium canned vegetables. Fruits All fresh, dried, or frozen fruit. Canned fruit in natural juice (without added sugar). Meat and other protein foods Skinless chicken or [place]. Ground chicken or [place]. Pork with fat trimmed off. Fish and seafood. Egg whites. Dried beans, peas, or lentils. Unsalted nuts, nut butters, and seeds. Unsalted canned beans. Lean cuts of beef with fat trimmed off. Low-sodium, lean deli meat. Dairy Low-fat (1%) or fat-free (skim) milk. Fat-free, low-fat, or reduced-fat cheeses. Nonfat, low-sodium ricotta or cottage cheese. Low-fat or nonfat yogurt. Low-fat, low-sodium cheese. Fats and oils Soft margarine without trans fats. Vegetable oil. Low-fat, reduced-fat, or light mayonnaise and salad dressings (reduced-sodium). Canola, safflower, olive, soybean, and sunflower oils. Avocado. Seasoning and other foods Herbs. Spices. Seasoning mixes without salt. Unsalted popcorn and pretzels. Fat-free sweets. What foods are not recommended? The items listed may not be a complete list. Talk with your dietitian about what dietary choices are best for you. Grains Baked goods made with fat, such as croissants, muffins, or some breads. Dry pasta or rice meal packs. Vegetables Creamed or fried vegetables. Vegetables in a cheese sauce. Regular canned vegetables (not low-sodium or reduced-sodium). Regular canned tomato sauce and paste (not low-sodium or reduced-sodium).  Regular tomato and vegetable juice (not low-sodium or reduced-sodium). Angie Fava. Olives. Fruits Canned fruit in a light or heavy syrup. Fried fruit. Fruit in cream or butter sauce. Meat and other protein foods Fatty cuts of meat. Ribs. Fried meat. Berniece Salines. Sausage. Bologna and other processed lunch meats. Salami. Fatback. Hotdogs. Bratwurst. Salted nuts and seeds. Canned beans with added salt. Canned or smoked fish. Whole eggs or egg yolks. Chicken or [place] with skin. Dairy Whole or 2% milk, cream, and half-and-half. Whole or full-fat cream cheese. Whole-fat or sweetened yogurt. Full-fat cheese. Nondairy creamers. Whipped toppings. Processed cheese and cheese spreads. Fats and oils Butter. Stick margarine. Lard. Shortening. Ghee. Bacon fat. Tropical oils, such as coconut, palm kernel, or palm oil. Seasoning and other foods Salted popcorn and pretzels. Onion salt, garlic salt, seasoned salt, table salt, and sea salt. Worcestershire sauce. Tartar sauce. Barbecue sauce. Teriyaki sauce. Soy sauce, including reduced-sodium. Steak sauce. Canned and packaged gravies. Fish sauce. Oyster sauce. Cocktail sauce. Horseradish that you find on the shelf. Ketchup. Mustard. Meat flavorings and tenderizers. Bouillon cubes. Hot sauce and Tabasco sauce. Premade or packaged marinades. Premade or packaged taco seasonings. Relishes. Regular salad dressings. Where to find more information:  National Heart, Lung, and [place]: https://wilson-eaton.com/  American Heart Association: www.heart.org Summary  The DASH eating plan is a healthy eating plan  that has been shown to reduce high blood pressure (hypertension). It may also reduce your risk for type 2 diabetes, heart disease, and stroke.  With the DASH eating plan, you should limit salt (sodium) intake to 2,300 mg a day. If you have hypertension, you may need to reduce your sodium intake to 1,500 mg a day.  When on the DASH eating plan, aim to eat more fresh fruits and  vegetables, whole grains, lean proteins, low-fat dairy, and heart-healthy fats.  Work with your health care provider or diet and nutrition specialist (dietitian) to adjust your eating plan to your individual calorie needs. This information is not intended to replace advice given to you by your health care provider. Make sure you discuss any questions you have with your health care provider. Document Released: 05/24/2011 Document Revised: 05/28/2016 Document Reviewed: 05/28/2016 Elsevier Interactive Patient Education  [employer]2018 Elsevier Inc.

## 2018-01-09 ENCOUNTER — Telehealth: Payer: Self-pay | Admitting: *Deleted

## 2018-01-09 DIAGNOSIS — Z79899 Other long term (current) drug therapy: Secondary | ICD-10-CM

## 2018-01-09 NOTE — Telephone Encounter (Signed)
-----   Message from Dyann KiefMichele M Lenze, PA-C sent at 01/09/2018  2:42 PM EDT ----- LDL high at 114. If he's committed to decrease the fat and processed foods in his diet & decrease eating out we can try diet instead of meds. We discussed this in the office yest

## 2018-02-10 ENCOUNTER — Ambulatory Visit: Payer: 59

## 2018-02-10 ENCOUNTER — Other Ambulatory Visit: Payer: 59

## 2018-03-25 ENCOUNTER — Other Ambulatory Visit: Payer: Self-pay

## 2018-03-25 ENCOUNTER — Emergency Department (HOSPITAL_BASED_OUTPATIENT_CLINIC_OR_DEPARTMENT_OTHER)
Admission: EM | Admit: 2018-03-25 | Discharge: 2018-03-25 | Disposition: A | Discharge status: Home / Self Care | Payer: 59 | Attending: Emergency Medicine | Admitting: Emergency Medicine

## 2018-03-25 ENCOUNTER — Encounter (HOSPITAL_BASED_OUTPATIENT_CLINIC_OR_DEPARTMENT_OTHER): Payer: Self-pay | Admitting: *Deleted

## 2018-03-25 DIAGNOSIS — Y999 Unspecified external cause status: Secondary | ICD-10-CM | POA: Insufficient documentation

## 2018-03-25 DIAGNOSIS — I10 Essential (primary) hypertension: Secondary | ICD-10-CM | POA: Diagnosis not present

## 2018-03-25 DIAGNOSIS — Z87891 Personal history of nicotine dependence: Secondary | ICD-10-CM | POA: Diagnosis not present

## 2018-03-25 DIAGNOSIS — W57XXXA Bitten or stung by nonvenomous insect and other nonvenomous arthropods, initial encounter: Secondary | ICD-10-CM | POA: Insufficient documentation

## 2018-03-25 DIAGNOSIS — Y9389 Activity, other specified: Secondary | ICD-10-CM | POA: Diagnosis not present

## 2018-03-25 DIAGNOSIS — S30861A Insect bite (nonvenomous) of abdominal wall, initial encounter: Secondary | ICD-10-CM | POA: Insufficient documentation

## 2018-03-25 DIAGNOSIS — Z79899 Other long term (current) drug therapy: Secondary | ICD-10-CM | POA: Diagnosis not present

## 2018-03-25 DIAGNOSIS — Y92007 Garden or yard of unspecified non-institutional (private) residence as the place of occurrence of the external cause: Secondary | ICD-10-CM | POA: Diagnosis not present

## 2018-03-25 MED ORDER — TRIAMCINOLONE ACETONIDE 0.1 % EX CREA
TOPICAL_CREAM | Freq: Three times a day (TID) | CUTANEOUS | Status: DC
  Filled 2018-03-25: qty 15

## 2018-03-25 MED ORDER — DIPHENHYDRAMINE HCL 25 MG PO CAPS
25.0000 mg | ORAL_CAPSULE | Freq: Once | ORAL | Status: AC
  Administered 2018-03-25: 25 mg via ORAL
  Filled 2018-03-25: qty 1

## 2018-03-25 MED ORDER — TRIAMCINOLONE ACETONIDE 0.1 % EX CREA: 1.0000 "application " | TOPICAL_CREAM | Freq: Two times a day (BID) | CUTANEOUS | 0 refills | Status: DC

## 2018-03-25 NOTE — ED Triage Notes (Signed)
Multiple insect bites to his abdomen last night. Itching and red.

## 2018-03-25 NOTE — ED Notes (Signed)
Reddened area outlined with marker; advised patient to return or follow up if redness becomes larger than outline.

## 2018-03-25 NOTE — Discharge Instructions (Addendum)
Continue with Benadryl.  Apply bacitracin ointment to outlined bite, apply triamcinolone cream to remaining bites.  Recheck with your PCP in 2 days, sooner if spreading outside the red area.

## 2018-03-25 NOTE — ED Provider Notes (Signed)
[facility] EMERGENCY DEPARTMENT Provider Note   CSN: [id number] Arrival date & time: 03/25/18  2118     History   Chief Complaint Chief Complaint  Patient presents with  . Insect Bite    HPI Kevin Navarro is a 42 y.o. male.  42 year old male presents with complaint of insect bites to his trunk.  Patient states that he took the trash out last night and while walking back in he felt like something was biting him, lifted his shirt and saw a small black bugs on him.  Patient reports for bites to his trunk.  Patient took Benadryl last night which helped with the swelling the site of the bites.  Continues to have itching and swelling at the 4 bite sites on his anterior trunk.  Denies difficulty breathing or swallowing.  No other complaints or concerns.     Past Medical History:  Diagnosis Date  . Chest pain of uncertain etiology 10/08/2016  . Chest pain with moderate risk for cardiac etiology 10/09/2016  . H/O cardiac catheterization    a. done 10/08/16 showing no evidence of CAD, myocardial bridging noted in the mid to distal LAD, normal LV function EF >65%,   . HTN (hypertension)   . Hyperglycemia   . Hypokalemia   . Meniscus, medial, bucket handle tear, old 06/22/2016  . Myocardial bridge   . Precordial pain   . Status post arthroscopic surgery of left knee 07/07/2016    Patient Active Problem List   Diagnosis Date Noted  . Hyperlipidemia 01/08/2018  . Myocardial bridge   . Hypokalemia   . Hyperglycemia   . HTN (hypertension)   . H/O cardiac catheterization   . Chest pain with moderate risk for cardiac etiology 10/09/2016  . Chest pain of uncertain etiology 10/08/2016  . Precordial pain   . Status post arthroscopic surgery of left knee 07/07/2016  . Meniscus, medial, bucket handle tear, old 06/22/2016    Past Surgical History:  Procedure Laterality Date  . KNEE ARTHROSCOPY WITH MEDIAL MENISECTOMY Left 06/22/2016   Procedure: LEFT KNEE ARTHROSCOPY  WITH PARTIAL MEDIAL MENISCECTOMY;  Surgeon: Kathryne Hitch, MD;  Location: [facility];  Service: Orthopedics;  Laterality: Left;  . LEFT HEART CATH AND CORONARY ANGIOGRAPHY N/A 10/08/2016   Procedure: Left Heart Cath and Coronary Angiography;  Surgeon: Kathleene Hazel, MD;  Location: [facility] INVASIVE CV LAB;  Service: Cardiovascular;  Laterality: N/A;  . None          Home Medications    Prior to Admission medications   Medication Sig Start Date End Date Taking? Authorizing Provider  metoprolol succinate (TOPROL-XL) 50 MG 24 hr tablet Take 1 tablet (50 mg total) by mouth daily. TAKE 1 TABLET BY MOUTH DAILY WITH OR IMMEDIATELY FOLLOWING A MEAL. 01/08/18  Yes Dyann Kief, PA-C  Multiple Vitamin (MULTIVITAMIN WITH MINERALS) TABS tablet Take 1 tablet by mouth daily.    [provider]  triamcinolone cream (KENALOG) 0.1 % Apply 1 application topically 2 (two) times daily. 03/25/18   Jeannie Fend, PA-C    Family History Family History  Problem Relation Age of Onset  . Hypertension Mother   . CVA Father     Social History Social History   Tobacco Use  . Smoking status: Former Smoker    Packs/day: 1.00    Years: 10.00    Pack years: 10.00    Types: Cigarettes    Last attempt to quit: 06/19/2011    Years since quitting: 6.7  .  Smokeless tobacco: Never Used  Substance Use Topics  . Alcohol use: Yes    Alcohol/week: 5.0 standard drinks    Types: 5 Cans of beer per week    Comment: 1 beer a week  . Drug use: No     Allergies   Bee venom and Hydrocodone   Review of Systems Review of Systems  Constitutional: Negative for fever.  HENT: Negative for trouble swallowing and voice change.   Respiratory: Negative for shortness of breath and wheezing.   Gastrointestinal: Negative for nausea and vomiting.  Musculoskeletal: Negative for arthralgias, joint swelling and myalgias.  Skin: Positive for rash. Negative for wound.  Allergic/Immunologic: Negative for  immunocompromised state.  Hematological: Negative for adenopathy. Does not bruise/bleed easily.  Psychiatric/Behavioral: Negative for confusion.  All other systems reviewed and are negative.    Physical Exam Updated Vital Signs BP (!) 170/93   Pulse 60   Temp 97.6 F (36.4 C) (Oral)   Resp 18   Ht 5\' 6"  (1.676 m)   Wt 76.7 kg   SpO2 97%   BMI 27.29 kg/m   Physical Exam  Constitutional: He is oriented to person, place, and time. He appears well-developed and well-nourished. No distress.  HENT:  Head: Normocephalic and atraumatic.  Cardiovascular: Intact distal pulses.  Pulmonary/Chest: Effort normal.  Neurological: He is alert and oriented to person, place, and time.  Skin: Skin is warm and dry. Rash noted. He is not diaphoretic.     Psychiatric: He has a normal mood and affect. His behavior is normal.  Nursing note and vitals reviewed.    ED Treatments / Results  Labs (all labs ordered are listed, but only abnormal results are displayed) Labs Reviewed - No data to display  EKG None  Radiology No results found.  Procedures Procedures (including critical care time)  Medications Ordered in ED Medications  triamcinolone cream (KENALOG) 0.1 % ( Topical Not Given 03/25/18 2207)  diphenhydrAMINE (BENADRYL) capsule 25 mg (25 mg Oral Given 03/25/18 2207)     Initial Impression / Assessment and Plan / ED Course  I have reviewed the triage vital signs and the nursing notes.  Pertinent labs & imaging results that were available during my care of the patient were reviewed by me and considered in my medical decision making (see chart for details).  Clinical Course as of Mar 25 2299  Tue Mar 25, 2018  2258 42yo male with insect bites to anterior trunk with redness which improved with benadryl last night. Area slightly improved with benadryl again tonight. Larger area will be outlined, recommend kenalog cream to areas, bacitracin to larger area, recheck with PCP in 2  days, sooner if spreads beyond outlined area.    [LM]    Clinical Course User Index [LM] Jeannie Fend, PA-C   Final Clinical Impressions(s) / ED Diagnoses   Final diagnoses:  Insect bite of abdominal wall, initial encounter    ED Discharge Orders         Ordered    triamcinolone cream (KENALOG) 0.1 %  2 times daily     03/25/18 2257           Jeannie Fend, PA-C 03/25/18 2300    Melene Plan, DO 03/25/18 2333

## 2018-06-19 IMAGING — CT CT HEAD W/O CM
4 series · 15 of 47 positions shown, 17 images · non-contrast
Comparison: None.

CLINICAL DATA: Headache and dizziness

EXAM:
CT HEAD WITHOUT CONTRAST
TECHNIQUE: Contiguous axial images were obtained from the base of the skull
through the vertex without intravenous contrast.

[Series 3: head wo · axial · 0.44mm/px · z∈[-146,-21]mm · 7 of 35 slices shown, 9 images]
[im 5/35  brain]
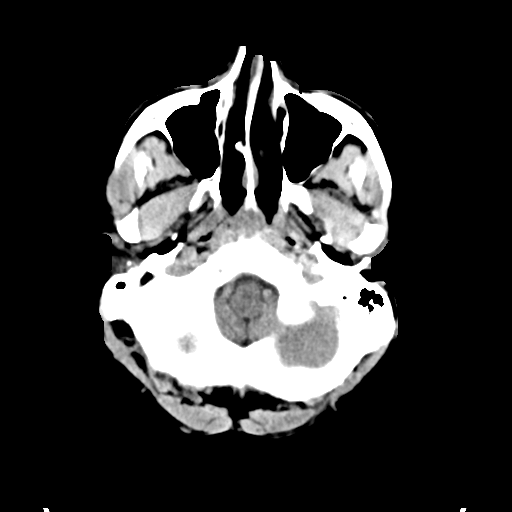
[im 5/35  bone]
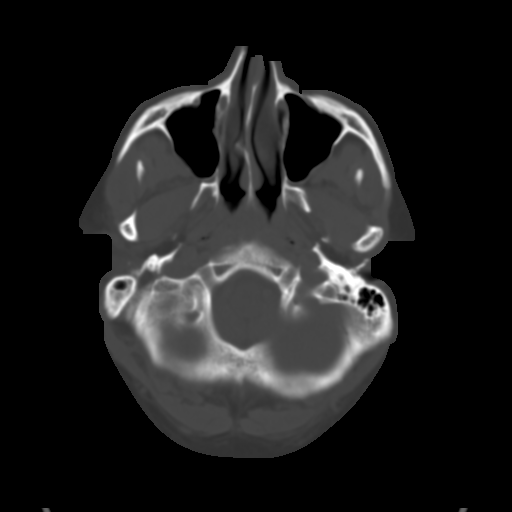
[im 9/35  brain]
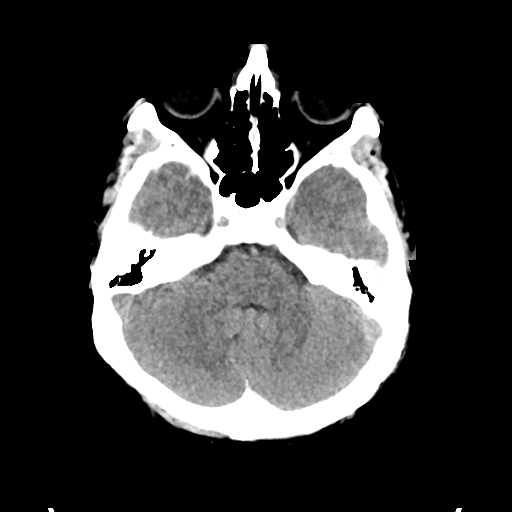
[im 13/35  brain]
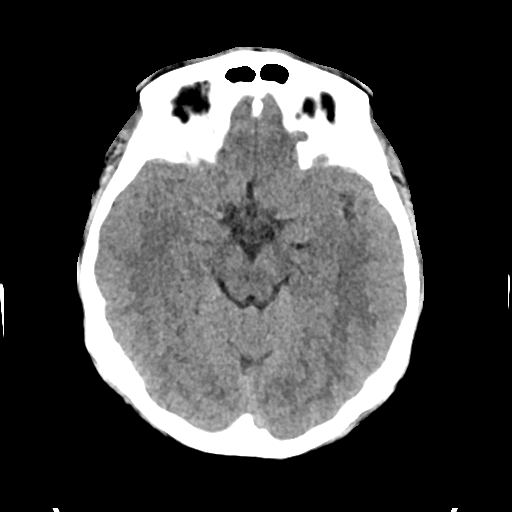
[im 18/35  brain]
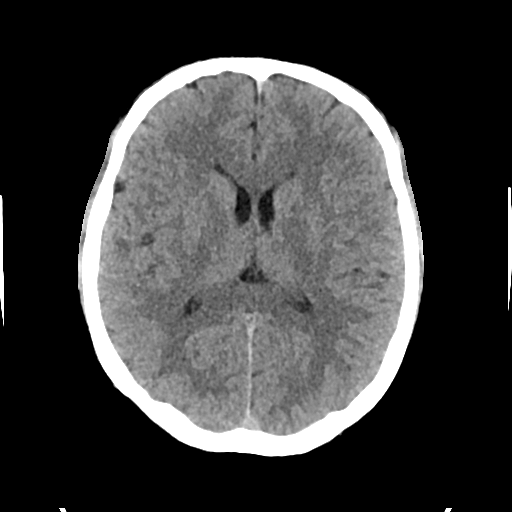
[im 22/35  brain]
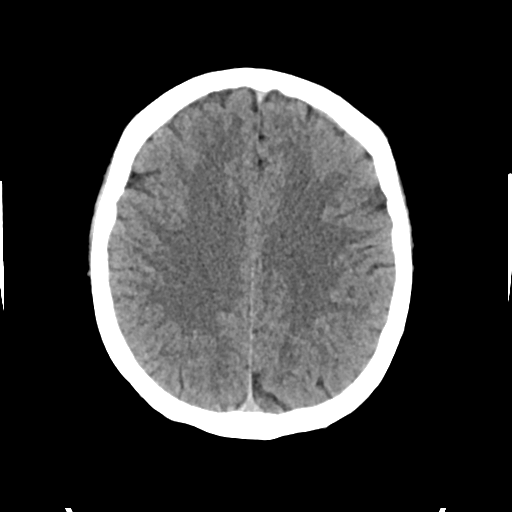
[im 22/35  bone]
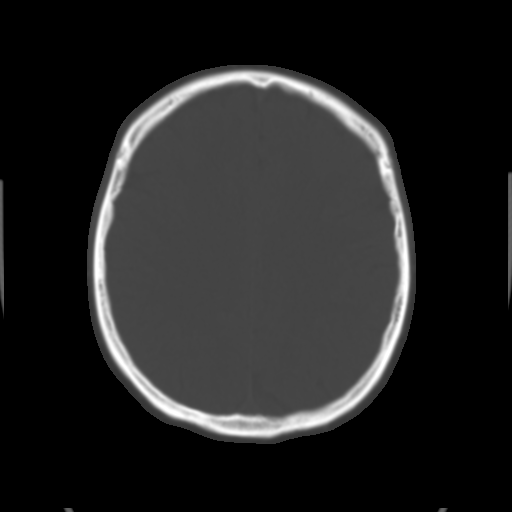
[im 26/35  brain]
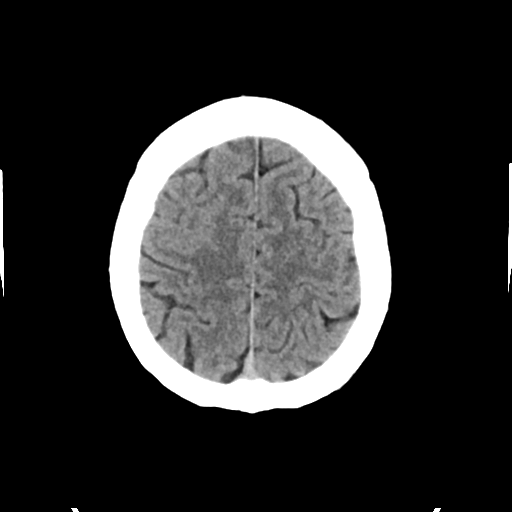
[im 30/35  brain]
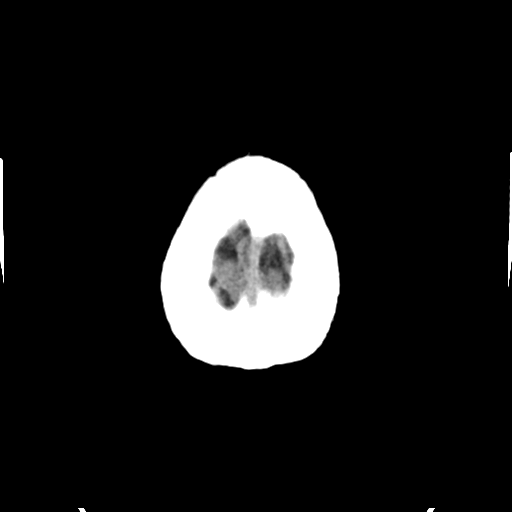

[Series 4: head bone · axial · 0.44mm/px · z∈[-150,-132]mm · 2 of 86 slices shown]
[im 9/86  bone]
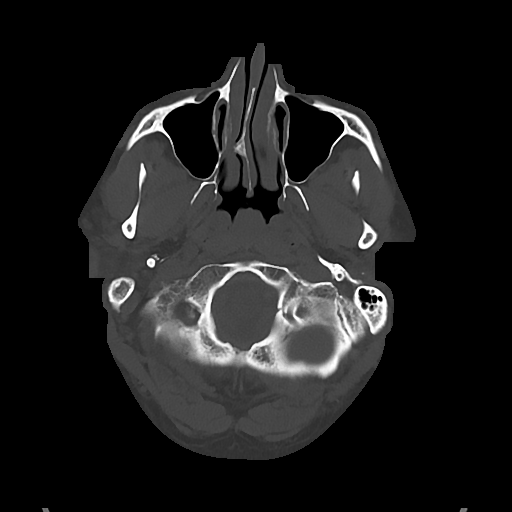
[im 18/86  bone]
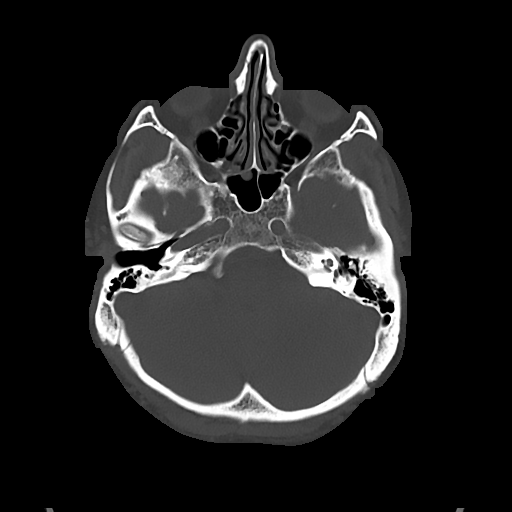

[Series 5: cor soft · coronal · 0.33mm/px · 3 of 72 slices shown]
[im 24/72  brain]
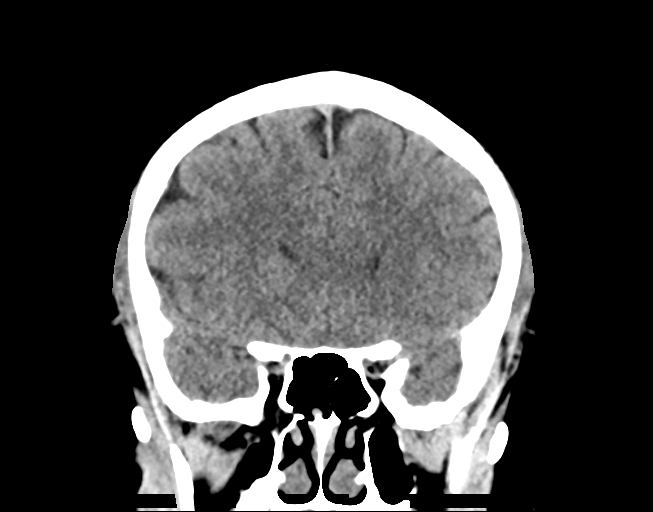
[im 32/72  brain]
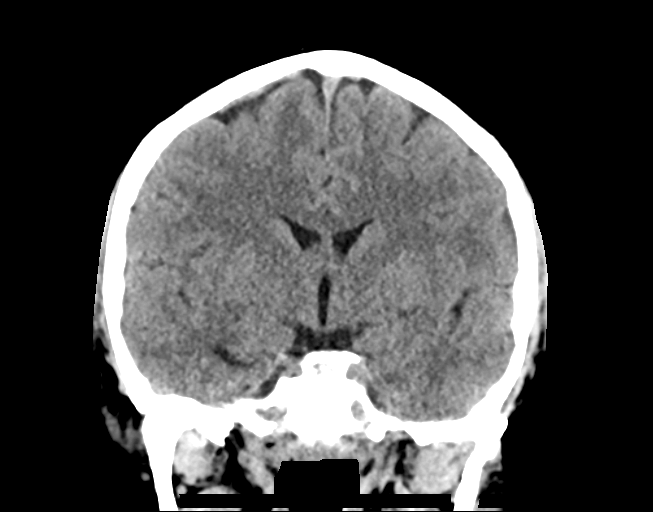
[im 40/72  brain]
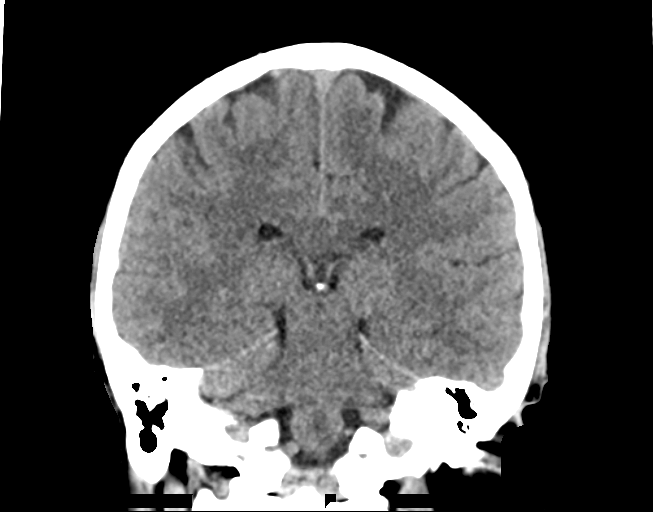

[Series 6: sag soft · sagittal · 0.33mm/px · 3 of 67 slices shown]
[im 23/67  brain]
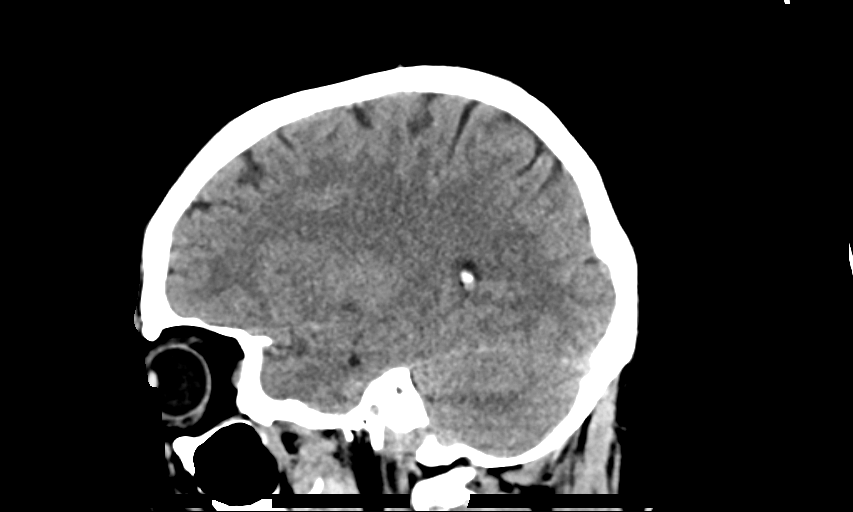
[im 34/67  brain]
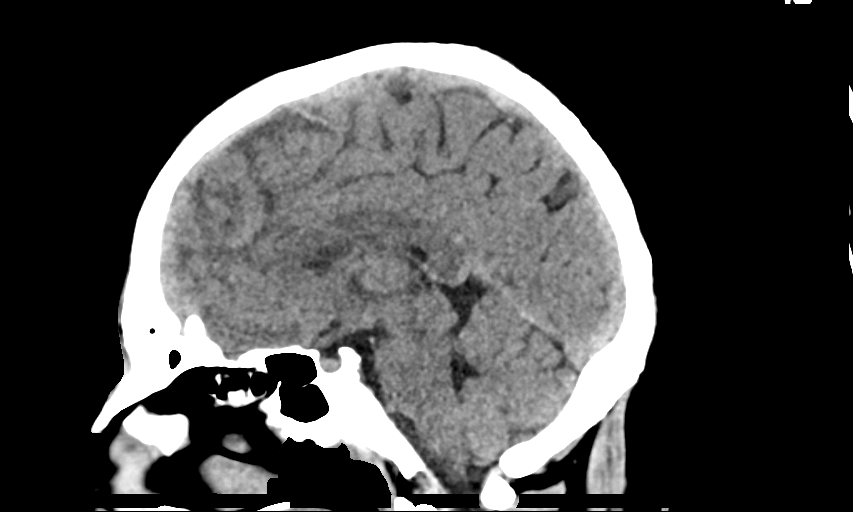
[im 45/67  brain]
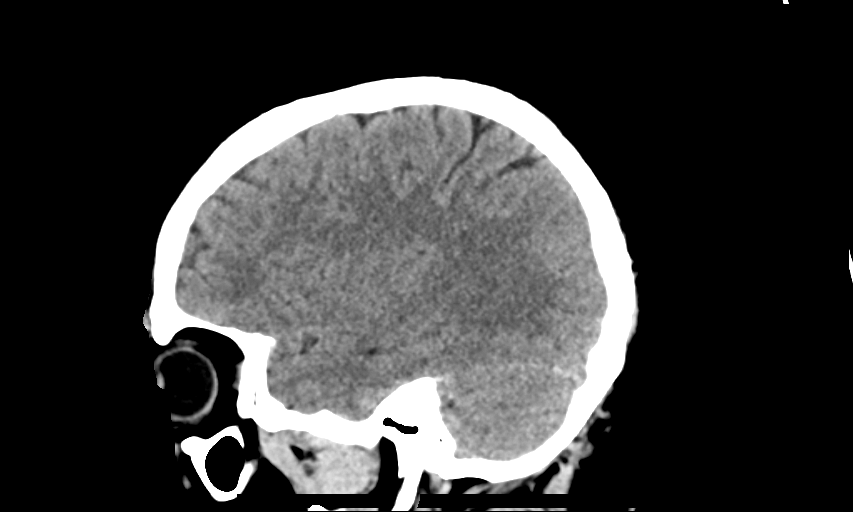

[15 of 47 positions shown; findings below may reference images not displayed]

FINDINGS: Brain: There is no mass, hemorrhage or extra-axial collection. The
size and configuration of the ventricles and extra-axial CSF spaces
are normal. There is no acute or chronic infarction. The brain
parenchyma is normal.

Vascular: No abnormal hyperdensity of the major intracranial
arteries or dural venous sinuses. No intracranial atherosclerosis.

Skull: The visualized skull base, calvarium and extracranial soft
tissues are normal.

Sinuses/Orbits: No fluid levels or advanced mucosal thickening of
the visualized paranasal sinuses. No mastoid or middle ear effusion.
The orbits are normal.
IMPRESSION: Normal head CT.

## 2018-09-15 ENCOUNTER — Telehealth: Payer: Self-pay | Admitting: Physician Assistant

## 2018-09-15 NOTE — Telephone Encounter (Signed)
Paged by patient's wife, BP 160s. Reassured the wife that although this BP is high, I doubt it is dangerous short term. Instructed her to give him 25mg  toprol XL for PM on top of the 50mg  AM dose this morning. She will monitor HR to make HR >50 bpm at rest. Wife will call tomorrow and arrange nonurgent e-visit in the next 1-2 month.

## 2018-09-16 ENCOUNTER — Telehealth: Payer: Self-pay

## 2018-09-16 ENCOUNTER — Telehealth: Payer: Self-pay | Admitting: Physician Assistant

## 2018-09-16 NOTE — Telephone Encounter (Signed)
This one would be good to set up for a webex visit today if you can. Let me know. Thanks

## 2018-09-16 NOTE — Telephone Encounter (Signed)
New Message    Pts wife is calling [place] back  Please call

## 2018-09-16 NOTE — Progress Notes (Signed)
Virtual Visit via Video Note    Evaluation Performed:  Follow-up visit  This visit type was conducted due to national recommendations for restrictions regarding the COVID-19 Pandemic (e.g. social distancing).  This format is felt to be most appropriate for this patient at this time.  All issues noted in this document were discussed and addressed.  No physical exam was performed (except for noted visual exam findings with Video Visits).  Please refer to the patient's chart (MyChart message for video visits and phone note for telephone visits) for the patient's consent to telehealth for [facility].  Date:  09/17/2018   ID:  Kevin Navarro, DOB [birth year], MRN [id number]  Patient Location:  Home  Provider location:   Home  [facility]P:  [facility]  Cardiologist:  Verne Carrow, MD   Electrophysiologist:  None   Chief Complaint:   High blood pressure  History of Present Illness:    Kevin Navarro is a 43 y.o. male who presents via audio/video conferencing for a telehealth visit today.    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 2D echo showed mild basal hypertrophy of the septum LVEF 55 to 60% trace of MR and TR.  Patient was in the ED 11/22/2017 with dizziness and headaches and nausea. He had a normal CT, normal electrolytes, negative d-dimer, negative troponin and EKG was unchanged. He had a slightly elevated TSH 5.288 and told to follow-up with PCP.  I saw the patient 01/08/2018 at which time he was only taking his metoprolol about 80% of the time and treated up without water right before he walked into that office visit.  Also was eating an extremely high sodium diet and eating [place]  food daily.  Elevated but on retake it had come down.  His wife agreed to check his blood pressure daily and come back for a nurse visit in 1 month which did not happen.  2 g sodium diet given.  LDL was high at 114 and patient wanted to try adjusting his diet before going on a statin.  Patient's wife called into the office 09/15/2018 because of elevated systolic blood pressures in the 160s.  They were instructed by the APP on call to take an extra Toprol XL 25 mg that evening and monitor his blood pressure and heart rate.  Patient says his BP has been running high, gained 3-4 lbs. Patient's wife says he eats fast food twice a day. Eats [employer] burritos every am. No regular exercise. Works in [employer] and trying to keep his social distance but says it's difficult.  Blood pressure consistently running high.  Has had trouble with allergies yesterday and his wife had to give him a breathing treatment which helped.  His PCP has been taking Allegra in the morning and Claritin-D in the afternoon.  Trying to get him in with an allergist.  He is also using Flonase.   The patient does not have symptoms concerning for COVID-19 infection (fever, chills, cough, or new shortness of breath).    Prior CV studies:   The following studies were reviewed today:  Echo 09/2016 study Conclusions   - Left ventricle: The cavity size was normal. There was mild focal   basal hypertrophy of the septum. Systolic function  was normal.   The estimated ejection fraction was in the range of 55% to 60%.   Wall motion was normal; there were no regional wall motion   abnormalities. Left ventricular diastolic function parameters   were normal.   Impressions:   - Normal LV systolic and diastolic function; trace MR and TR.    Cardiac cath 4/23/2018The left ventricular systolic function is normal.  LV end diastolic pressure is normal.  The left ventricular ejection fraction is greater than 65% by visual estimate.   There is no mitral valve regurgitation.   1. No angiographic evidence of CAD 2. Myocardial bridging noted in the mid to distal LAD 3. Normal LV systolic function 4. No evidence of ascending aortic dissection   Recommendations: Will monitor on telemetry tonight. Echo in am to exclude pericardial effusion and structural heart disease.          Past Medical History:  Diagnosis Date  . Chest pain of uncertain etiology 10/08/2016  . Chest pain with moderate risk for cardiac etiology 10/09/2016  . H/O cardiac catheterization    a. done 10/08/16 showing no evidence of CAD, myocardial bridging noted in the mid to distal LAD, normal LV function EF >65%,   . HTN (hypertension)   . Hyperglycemia   . Hypokalemia   . Meniscus, medial, bucket handle tear, old 06/22/2016  . Myocardial bridge   . Precordial pain   . Status post arthroscopic surgery of left knee 07/07/2016   Past Surgical History:  Procedure Laterality Date  . KNEE ARTHROSCOPY WITH MEDIAL MENISECTOMY Left 06/22/2016   Procedure: LEFT KNEE ARTHROSCOPY WITH PARTIAL MEDIAL MENISCECTOMY;  Surgeon: Kathryne Hitch, MD;  Location: [facility];  Service: Orthopedics;  Laterality: Left;  . LEFT HEART CATH AND CORONARY ANGIOGRAPHY N/A 10/08/2016   Procedure: Left Heart Cath and Coronary Angiography;  Surgeon: Kathleene Hazel, MD;  Location: [facility] INVASIVE CV LAB;  Service: Cardiovascular;  Laterality: N/A;  . None       Current Meds  Medication Sig  . metoprolol succinate (TOPROL-XL) 50 MG 24 hr tablet Take 1 tablet (50 mg total) by mouth daily. TAKE 1 TABLET BY MOUTH DAILY WITH OR IMMEDIATELY FOLLOWING A MEAL.  . Multiple Vitamin (MULTIVITAMIN WITH MINERALS) TABS tablet Take 1 tablet by mouth daily.     Allergies:   Bee venom and Hydrocodone   Social History   Tobacco Use  . Smoking status: Former Smoker    Packs/day: 1.00    Years: 10.00    Pack years: 10.00    Types: Cigarettes    Last attempt to quit: 06/19/2011    Years  since quitting: 7.2  . Smokeless tobacco: Never Used  Substance Use Topics  . Alcohol use: Yes    Alcohol/week: 5.0 standard drinks    Types: 5 Cans of beer per week    Comment: 1 beer a week  . Drug use: No     Family Hx: The patient's family history includes CVA in his father; Hypertension in his mother.  ROS:   Please see the history of present illness.    Review of Systems  Constitution: Positive for malaise/fatigue.  HENT: Positive for congestion.   Cardiovascular: Negative.   Respiratory: Negative.   Endocrine: Negative.   Hematologic/Lymphatic: Negative.   Musculoskeletal: Negative.        Chronic leg cramps at night since he was a child-helps to stretch  Gastrointestinal: Negative.   Genitourinary: Negative.   Neurological: Negative.  All other systems reviewed and are negative.   Labs/Other Tests and Data Reviewed:    Recent Labs: 11/21/2017: BUN 15; Creatinine, Ser 0.81; Hemoglobin 13.6; Platelets 261; Potassium 3.5; Sodium 139 11/22/2017: TSH 5.288   Recent Lipid Panel Lab Results  Component Value Date/Time   CHOL 175 01/08/2018 09:49 AM   TRIG 91 01/08/2018 09:49 AM   HDL 43 01/08/2018 09:49 AM   CHOLHDL 4.1 01/08/2018 09:49 AM   CHOLHDL 4.2 10/09/2016 11:01 AM   LDLCALC 114 (H) 01/08/2018 09:49 AM    Wt Readings from Last 3 Encounters:  09/17/18 170 lb (77.1 kg)  03/25/18 169 lb 1.5 oz (76.7 kg)  01/08/18 169 lb (76.7 kg)     Objective:    Vital Signs:  BP (!) 144/100   Pulse 68   Ht 5\' 6"  (1.676 m)   Wt 170 lb (77.1 kg)   SpO2 99%   BMI 27.44 kg/m    Well nourished, well developed male in no acute distress.  no JVD or leg swelling.  ASSESSMENT & PLAN:    1.  Essential hypertension patient's blood pressure elevated recently and up today.  Eating fast foods twice a day.  Long discussion about the importance of 2 g sodium diet and regular exercise.  Will increase Toprol-XL to 100 mg daily.  His wife will keep an eye on his blood pressure  and pulse.  Call us if he does not tolerate this dose and we can change him to another medication such as HCTZ or lisinopril.  2.Myocardial bridge in the mid to distal LAD with normal LVEF and no evidence of aortic dissection at cardiac cath 2018, 2D echo normal LVEF 55 to 60% with a trace of MR and TR mild basal hypertrophy of the septum.  No chest pain  Hyperlipidemia LDL 114 total cholesterol 416 12/2017 should have repeat this summer  COVID-19 Education: The signs and symptoms of COVID-19 were discussed with the patient and how to seek care for testing (follow up with PCP or arrange E-visit).   The importance of social distancing was discussed today.  Patient Risk:   After full review of this patient's clinical status, I feel that they are at least moderate risk at this time.  Time:   Today, I have spent 26 minutes with the patient with telehealth technology discussing hypertension and overall health.     Medication Adjustments/Labs and Tests Ordered: Current medicines are reviewed at length with the patient today.  Concerns regarding medicines are outlined above.  Tests Ordered: No orders of the defined types were placed in this encounter.  Medication Changes: No orders of the defined types were placed in this encounter.   Disposition:  Follow up in 1 month(s) with me for televisit if BP still high. If it comes down he can f/u in 4 months with myself  Signed, Jacolyn Reedy, PA-C  09/17/2018 10:00 AM    [facility]

## 2018-09-16 NOTE — Telephone Encounter (Signed)
Virtual Visit Pre-Appointment Phone Call     TELEPHONE CALL NOTE  Middleton Daquino has been deemed a candidate for a follow-up tele-health visit to limit community exposure during the Covid-19 pandemic. I spoke with the patient via phone to ensure availability of phone/video source, confirm preferred email & phone number, and discuss instructions and expectations.  I reminded Laquincy Bry to be prepared with any vital sign and/or heart rhythm information that could potentially be obtained via home monitoring, at the time of his visit. I reminded Mohammedali Korsak to expect a phone call at the time of his visit if his visit.  Did the patient verbally acknowledge consent to treatment? Yes  VIDEO Visit arranged with Jacolyn Reedy, PA tomorrow at 10:00 AM  Lattie Haw, RN 09/16/2018 2:18 PM   DOWNLOADING THE WEBEX SOFTWARE TO SMARTPHONE  - If Apple, go to App Store and type in WebEx in the search bar. Download [employer], the blue/green circle. The app is free but as with any other app downloads, their phone may require them to verify saved payment information or Apple password. The patient does NOT have to create an account.  - If Android, ask patient to go to [employer] and type in WebEx in the search bar. Download [employer], the blue/green circle. The app is free but as with any other app downloads, their phone may require them to verify saved payment information or Android password. The patient does NOT have to create an account.   CONSENT FOR TELE-HEALTH VISIT - PLEASE REVIEW  I hereby voluntarily request, consent and authorize [facility] and its employed or contracted physicians, physician assistants, nurse practitioners or other licensed health care professionals (the Practitioner), to provide me with telemedicine health care services (the "Services") as deemed necessary by the treating Practitioner. I acknowledge and  consent to receive the Services by the Practitioner via telemedicine. I understand that the telemedicine visit will involve communicating with the Practitioner through live audiovisual communication technology and the disclosure of certain medical information by electronic transmission. I acknowledge that I have been given the opportunity to request an in-person assessment or other available alternative prior to the telemedicine visit and am voluntarily participating in the telemedicine visit.  I understand that I have the right to withhold or withdraw my consent to the use of telemedicine in the course of my care at any time, without affecting my right to future care or treatment, and that the Practitioner or I may terminate the telemedicine visit at any time. I understand that I have the right to inspect all information obtained and/or recorded in the course of the telemedicine visit and may receive copies of available information for a reasonable fee.  I understand that some of the potential risks of receiving the Services via telemedicine include:  Marland Kitchen Delay or interruption in medical evaluation due to technological equipment failure or disruption; . Information transmitted may not be sufficient (e.g. poor resolution of images) to allow for appropriate medical decision making by the Practitioner; and/or  . In rare instances, security protocols could fail, causing a breach of personal health information.  Furthermore, I acknowledge that it is my responsibility to provide information about my medical history, conditions and care that is complete and accurate to the best of my ability. I acknowledge that Practitioner's advice, recommendations, and/or decision may be based on factors not within their control, such as incomplete or inaccurate data provided by me or distortions of diagnostic  images or specimens that may result from electronic transmissions. I understand that the practice of medicine is not an  exact science and that Practitioner makes no warranties or guarantees regarding treatment outcomes. I acknowledge that I will receive a copy of this consent concurrently upon execution via email to the email address I last provided but may also request a printed copy by calling the office of [facility].    I understand that my insurance will be billed for this visit.   I have read or had this consent read to me. . I understand the contents of this consent, which adequately explains the benefits and risks of the Services being provided via telemedicine.  . I have been provided ample opportunity to ask questions regarding this consent and the Services and have had my questions answered to my satisfaction. . I give my informed consent for the services to be provided through the use of telemedicine in my medical care  By participating in this telemedicine visit I agree to the above.

## 2018-09-16 NOTE — Telephone Encounter (Signed)
Left message to call back  

## 2018-09-16 NOTE — Telephone Encounter (Signed)
Pt is currently locked out of his mychart account but was able to give consent over the phone.     Virtual Visit Pre-Appointment Phone Call  Steps For Call:  1. Confirm consent - "In the setting of the current Covid19 crisis, you are scheduled for a (phone or video) visit with your provider on (date) at (time).  Just as we do with many in-office visits, in order for you to participate in this visit, we must obtain consent.  If you'd like, I can send this to your mychart (if signed up) or email for you to review.  Otherwise, I can obtain your verbal consent now.  All virtual visits are billed to your insurance company just like a normal visit would be.  By agreeing to a virtual visit, we'd like you to understand that the technology does not allow for your provider to perform an examination, and thus may limit your provider's ability to fully assess your condition.  Finally, though the technology is pretty good, we cannot assure that it will always work on either your or our end, and in the setting of a video visit, we may have to convert it to a phone-only visit.  In either situation, we cannot ensure that we have a secure connection.  Are you willing to proceed?"  2. Give patient instructions for WebEx download to smartphone as below if video visit  3. Advise patient to be prepared with any vital sign or heart rhythm information, their current medicines, and a piece of paper and pen handy for any instructions they may receive the day of their visit  4. Inform patient they will receive a phone call 15 minutes prior to their appointment time (may be from unknown caller ID) so they should be prepared to answer  5. Confirm that appointment type is correct in Epic appointment notes (video vs telephone)    TELEPHONE CALL NOTE  Kevin Navarro has been deemed a candidate for a follow-up tele-health visit to limit community exposure during the Covid-19 pandemic. I spoke with the patient via  phone to ensure availability of phone/video source, confirm preferred email & phone number, and discuss instructions and expectations.  I reminded Kevin Navarro to be prepared with any vital sign and/or heart rhythm information that could potentially be obtained via home monitoring, at the time of his visit. I reminded Kevin Navarro to expect a phone call at the time of his visit if his visit.  Did the patient verbally acknowledge consent to treatment? YES   Clide Dales Ladye Macnaughton, CMA 09/16/2018 2:46 PM   DOWNLOADING THE WEBEX SOFTWARE TO SMARTPHONE  - If Apple, go to [employer] and type in WebEx in the search bar. Download [employer], the blue/green circle. The app is free but as with any other app downloads, their phone may require them to verify saved payment information or Apple password. The patient does NOT have to create an account.  - If Android, ask patient to go to [employer] and type in WebEx in the search bar. Download [employer], the blue/green circle. The app is free but as with any other app downloads, their phone may require them to verify saved payment information or Android password. The patient does NOT have to create an account.   CONSENT FOR TELE-HEALTH VISIT - PLEASE REVIEW  I hereby voluntarily request, consent and authorize [facility] and its employed or contracted physicians, Producer, television/film/video, nurse practitioners or other licensed health care professionals (the  Practitioner), to provide me with telemedicine health care services (the "Services") as deemed necessary by the treating Practitioner. I acknowledge and consent to receive the Services by the Practitioner via telemedicine. I understand that the telemedicine visit will involve communicating with the Practitioner through live audiovisual communication technology and the disclosure of certain medical information by electronic transmission. I acknowledge that I have been  given the opportunity to request an in-person assessment or other available alternative prior to the telemedicine visit and am voluntarily participating in the telemedicine visit.  I understand that I have the right to withhold or withdraw my consent to the use of telemedicine in the course of my care at any time, without affecting my right to future care or treatment, and that the Practitioner or I may terminate the telemedicine visit at any time. I understand that I have the right to inspect all information obtained and/or recorded in the course of the telemedicine visit and may receive copies of available information for a reasonable fee.  I understand that some of the potential risks of receiving the Services via telemedicine include:  Marland Kitchen Delay or interruption in medical evaluation due to technological equipment failure or disruption; . Information transmitted may not be sufficient (e.g. poor resolution of images) to allow for appropriate medical decision making by the Practitioner; and/or  . In rare instances, security protocols could fail, causing a breach of personal health information.  Furthermore, I acknowledge that it is my responsibility to provide information about my medical history, conditions and care that is complete and accurate to the best of my ability. I acknowledge that Practitioner's advice, recommendations, and/or decision may be based on factors not within their control, such as incomplete or inaccurate data provided by me or distortions of diagnostic images or specimens that may result from electronic transmissions. I understand that the practice of medicine is not an exact science and that Practitioner makes no warranties or guarantees regarding treatment outcomes. I acknowledge that I will receive a copy of this consent concurrently upon execution via email to the email address I last provided but may also request a printed copy by calling the office of [facility].    I  understand that my insurance will be billed for this visit.   I have read or had this consent read to me. . I understand the contents of this consent, which adequately explains the benefits and risks of the Services being provided via telemedicine.  . I have been provided ample opportunity to ask questions regarding this consent and the Services and have had my questions answered to my satisfaction. . I give my informed consent for the services to be provided through the use of telemedicine in my medical care  By participating in this telemedicine visit I agree to the above.

## 2018-09-16 NOTE — Telephone Encounter (Signed)
Left message for patient to call back  

## 2018-09-17 ENCOUNTER — Encounter: Payer: Self-pay | Admitting: Physician Assistant

## 2018-09-17 ENCOUNTER — Telehealth (INDEPENDENT_AMBULATORY_CARE_PROVIDER_SITE_OTHER): Payer: 59 | Admitting: Physician Assistant

## 2018-09-17 ENCOUNTER — Other Ambulatory Visit: Payer: Self-pay

## 2018-09-17 VITALS — BP 144/100 | HR 68 | Ht 66.0 in | Wt 170.0 lb

## 2018-09-17 DIAGNOSIS — I10 Essential (primary) hypertension: Secondary | ICD-10-CM

## 2018-09-17 DIAGNOSIS — E782 Mixed hyperlipidemia: Secondary | ICD-10-CM

## 2018-09-17 DIAGNOSIS — Q245 Malformation of coronary vessels: Secondary | ICD-10-CM

## 2018-09-17 MED ORDER — METOPROLOL SUCCINATE ER 100 MG PO TB24: 100.0000 mg | ORAL_TABLET | Freq: Every day | ORAL | 3 refills | Status: DC

## 2018-09-17 NOTE — Patient Instructions (Signed)
Medication Instructions:  Your physician has recommended you make the following change in your medication:   INCREASE: metoprolol succinate (Toprol-XL) to 100 mg once a day  Lab work: Your physician recommends that you return for a FASTING lipid profile in the summer   If you have labs (blood work) drawn today and your tests are completely normal, you will receive your results only by: Marland Kitchen MyChart Message (if you have MyChart) OR . A paper copy in the mail If you have any lab test that is abnormal or we need to change your treatment, we will call you to review the results.  Testing/Procedures: None ordered  Follow-Up: . Monitor your Blood Pressure and call back to report readings in 1 week  Any Other Special Instructions Will Be Listed Below (If Applicable).

## 2018-09-17 NOTE — Telephone Encounter (Signed)
Patient had VIDEO Visit with Jacolyn Reedy, PA today.

## 2018-10-14 ENCOUNTER — Telehealth: Payer: Self-pay

## 2018-10-14 NOTE — Telephone Encounter (Signed)
Called and spoke to patient's wife (DPR on file) to follow up with patient's BP. She states that she has not been checking it. Instructed for her to check his BP and call to report readings in 1 week. Made her aware that his f/u will be based on his readings. She verbalized understanding.

## 2019-01-22 ENCOUNTER — Emergency Department (HOSPITAL_COMMUNITY): Payer: 59

## 2019-01-22 ENCOUNTER — Other Ambulatory Visit: Payer: Self-pay

## 2019-01-22 ENCOUNTER — Emergency Department (HOSPITAL_COMMUNITY)
Admission: EM | Admit: 2019-01-22 | Discharge: 2019-01-22 | Disposition: A | Discharge status: Home / Self Care | Payer: 59 | Attending: Emergency Medicine | Admitting: Emergency Medicine

## 2019-01-22 ENCOUNTER — Telehealth: Payer: Self-pay | Admitting: Cardiovascular Disease

## 2019-01-22 ENCOUNTER — Encounter (HOSPITAL_COMMUNITY): Payer: Self-pay

## 2019-01-22 DIAGNOSIS — R079 Chest pain, unspecified: Secondary | ICD-10-CM | POA: Diagnosis not present

## 2019-01-22 DIAGNOSIS — Z5321 Procedure and treatment not carried out due to patient leaving prior to being seen by health care provider: Secondary | ICD-10-CM | POA: Insufficient documentation

## 2019-01-22 DIAGNOSIS — R110 Nausea: Secondary | ICD-10-CM | POA: Diagnosis not present

## 2019-01-22 LAB — BASIC METABOLIC PANEL
Anion gap: 10 (ref 5–15)
BUN: 14 mg/dL (ref 6–20)
CO2: 24 mmol/L (ref 22–32)
Calcium: 9 mg/dL (ref 8.9–10.3)
Chloride: 105 mmol/L (ref 98–111)
Creatinine, Ser: 0.82 mg/dL (ref 0.61–1.24)
GFR calc Af Amer: >60 mL/min (ref >60)
GFR calc non Af Amer: >60 mL/min (ref >60)
Glucose, Bld: 111 mg/dL — ABNORMAL HIGH (ref 70–99)
Potassium: 3.8 mmol/L (ref 3.5–5.1)
Sodium: 139 mmol/L (ref 135–145)

## 2019-01-22 LAB — CBC
HCT: 41.7 % (ref 39.0–52.0)
Hemoglobin: 13.8 g/dL (ref 13.0–17.0)
MCH: 29.7 pg (ref 26.0–34.0)
MCHC: 33.1 g/dL (ref 30.0–36.0)
MCV: 89.9 fL (ref 80.0–100.0)
Platelets: 274 10*3/uL (ref 150–400)
RBC: 4.64 MIL/uL (ref 4.22–5.81)
RDW: 12 % (ref 11.5–15.5)
WBC: 6.7 10*3/uL (ref 4.0–10.5)
nRBC: 0 % (ref 0.0–0.2)

## 2019-01-22 LAB — TROPONIN I (HIGH SENSITIVITY): Troponin I (High Sensitivity): 3 ng/L (ref <18)

## 2019-01-22 MED ORDER — SODIUM CHLORIDE 0.9% FLUSH: 3.0000 mL | Freq: Once | INTRAVENOUS | Status: DC

## 2019-01-22 NOTE — ED Notes (Signed)
Wife to sort states she taking pt home

## 2019-01-22 NOTE — Telephone Encounter (Signed)
[name], pts spouse called back regarding my advisement for [name] to go to ED. Nira Conn states that pt does not want to go to the ED and asks if we can work pt in at the office today. I explained the urgency of the situation considering her husbands symptoms and reiterated how important it was for Kevin Navarro to go to the hospital in order to be further evaluated. Pt's wife agreeable and states they will go to the ED.

## 2019-01-22 NOTE — ED Triage Notes (Signed)
Pt reports nausea, weakness and intermittent chest pain x7 days. Pt reports that he feels as though it is worsening. Endorses generalized weakness, denies fevers.

## 2019-01-22 NOTE — Telephone Encounter (Signed)
New Message    STAT if patient feels like he/she is going to faint   1) Are you dizzy now? Yes  2) Do you feel faint or have you passed out? Sometimes, has that feeling right now  3) Do you have any other symptoms? Feels weak, has dizziness, headache, chest pressure, nausea.   4) Have you checked your HR and BP (record if available)? 150/90 hr 54 169/95 hr 54

## 2019-01-22 NOTE — Telephone Encounter (Signed)
Pts spouse, Nira Conn reports BP of 150/90 with a HR of 54 She reports pt feels pressure in chest but not pain when his BP is elevated, pt reports continuous headache and nausea. Pt reports feeling light headed and dizzy which worsens on exertion Symptoms have been going on for the past week, Denies fever, cough, chest pain, sweating or swelling.   Advised pt to go to the emergency department to be evaluated further at this time. Pt agreeable to going to ED. Will route to [facility]

## 2019-01-23 MED ORDER — AMLODIPINE BESYLATE 5 MG PO TABS: 5.0000 mg | ORAL_TABLET | Freq: Every day | ORAL | 3 refills | Status: DC

## 2019-01-23 NOTE — Telephone Encounter (Signed)
I would continue Toprol 100 mg daily. We can add Norvasc 5 mg daily. If BP is still elevated in one week, increase Norvasc to 10 mg daily.   Thanks, chris

## 2019-01-23 NOTE — Addendum Note (Signed)
Addended by: Loren Racer on: 01/23/2019 10:28 AM   Modules accepted: Orders

## 2019-01-23 NOTE — Telephone Encounter (Signed)
Spoke with wife and went over recommendations.  Advised her to have pt monitor BP 2-4 hours after medications and if BP remains elevated in week, increase to 10mg  but call our office and let us know so we can keep chart up to date. [name] verbalized understanding and was in agreement with this plan.

## 2019-01-23 NOTE — Telephone Encounter (Signed)
Follow up    Patients wife Nira Conn is calling because they took the patient to the ED but left before the patient was seen by a physician. She states that his BP is 144/93. They are wanting to be seen today. Please call [phone]

## 2019-01-23 NOTE — Telephone Encounter (Signed)
Spoke with wife, DPR on file. Pt's BP had been elevated for over a week now.  Called the office yesterday and was told to go to [facility].  Pt went to ER, they did EKG and labs.  They were there for 2 hours and were told that there were 14 people infront of him so he left.  Pt's BP this morning was 144/99 prior to his Metoprolol Succ 100mg .  Wife states BP is lowest in AM and seems to spike midday-evening.  Yesterday after ER, wife gave pt extra 1/2 of Metoprolol and BP 2 hours later was 169/95.  When BP elevated pt has HA, nausea, lightheadedness and chest pressure.  Wife did mention that pt's stress level is high right now because his mom is in [place] in the hospital dying from [place].  BP in evening is usually 170s/100s.  Advised I will send to Dr. Angelena Form for review and advisement.

## 2019-02-03 NOTE — Telephone Encounter (Signed)
I placed call to pt's wife to get update on BP readings.  Left message to call office

## 2019-08-19 IMAGING — DX CHEST - 2 VIEW
2 series · 2 of 2 positions shown · non-contrast
Comparison: 08/27/2001

CLINICAL DATA: Chest pain for 1 week.

EXAM:
CHEST - 2 VIEW

[chest pa]
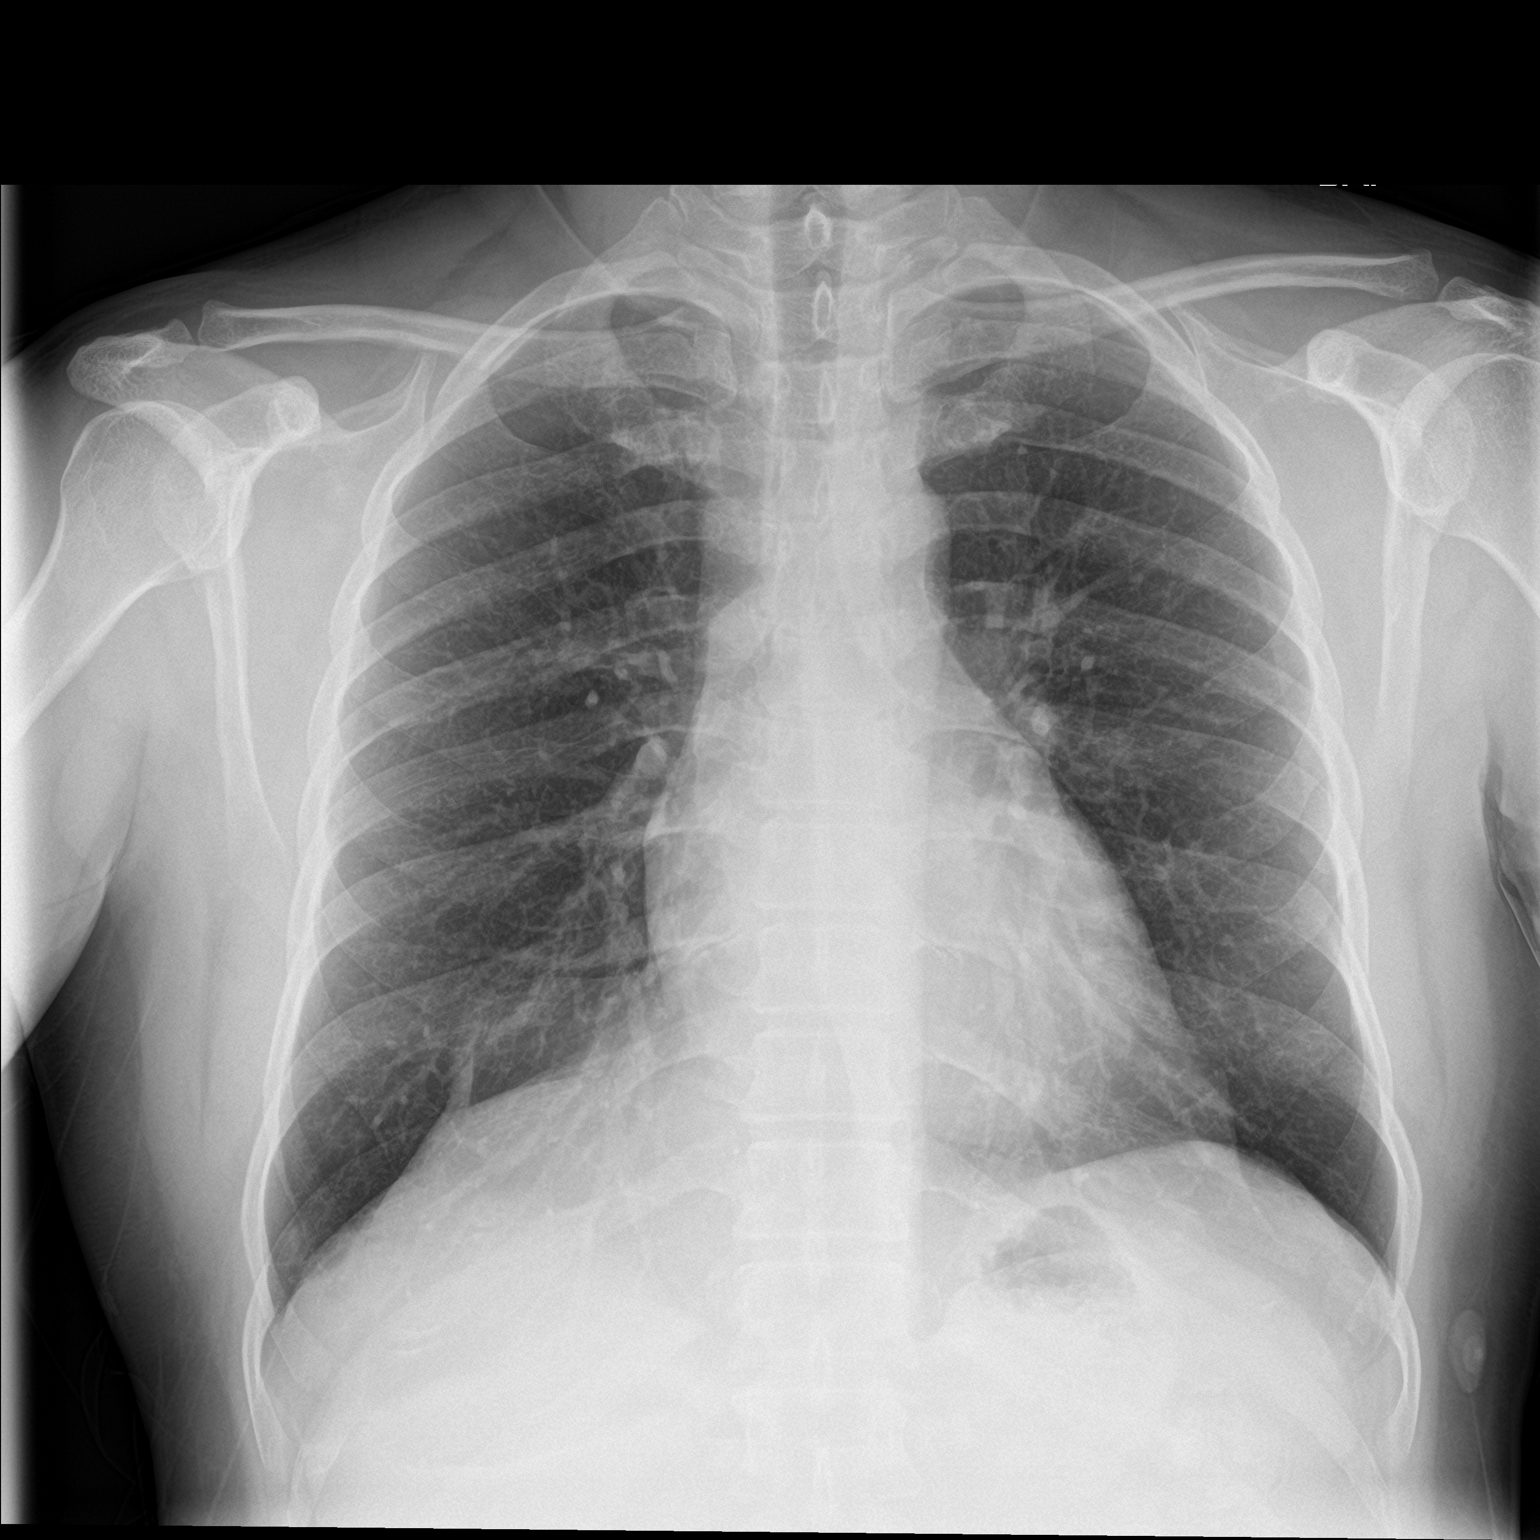

[chest lat]
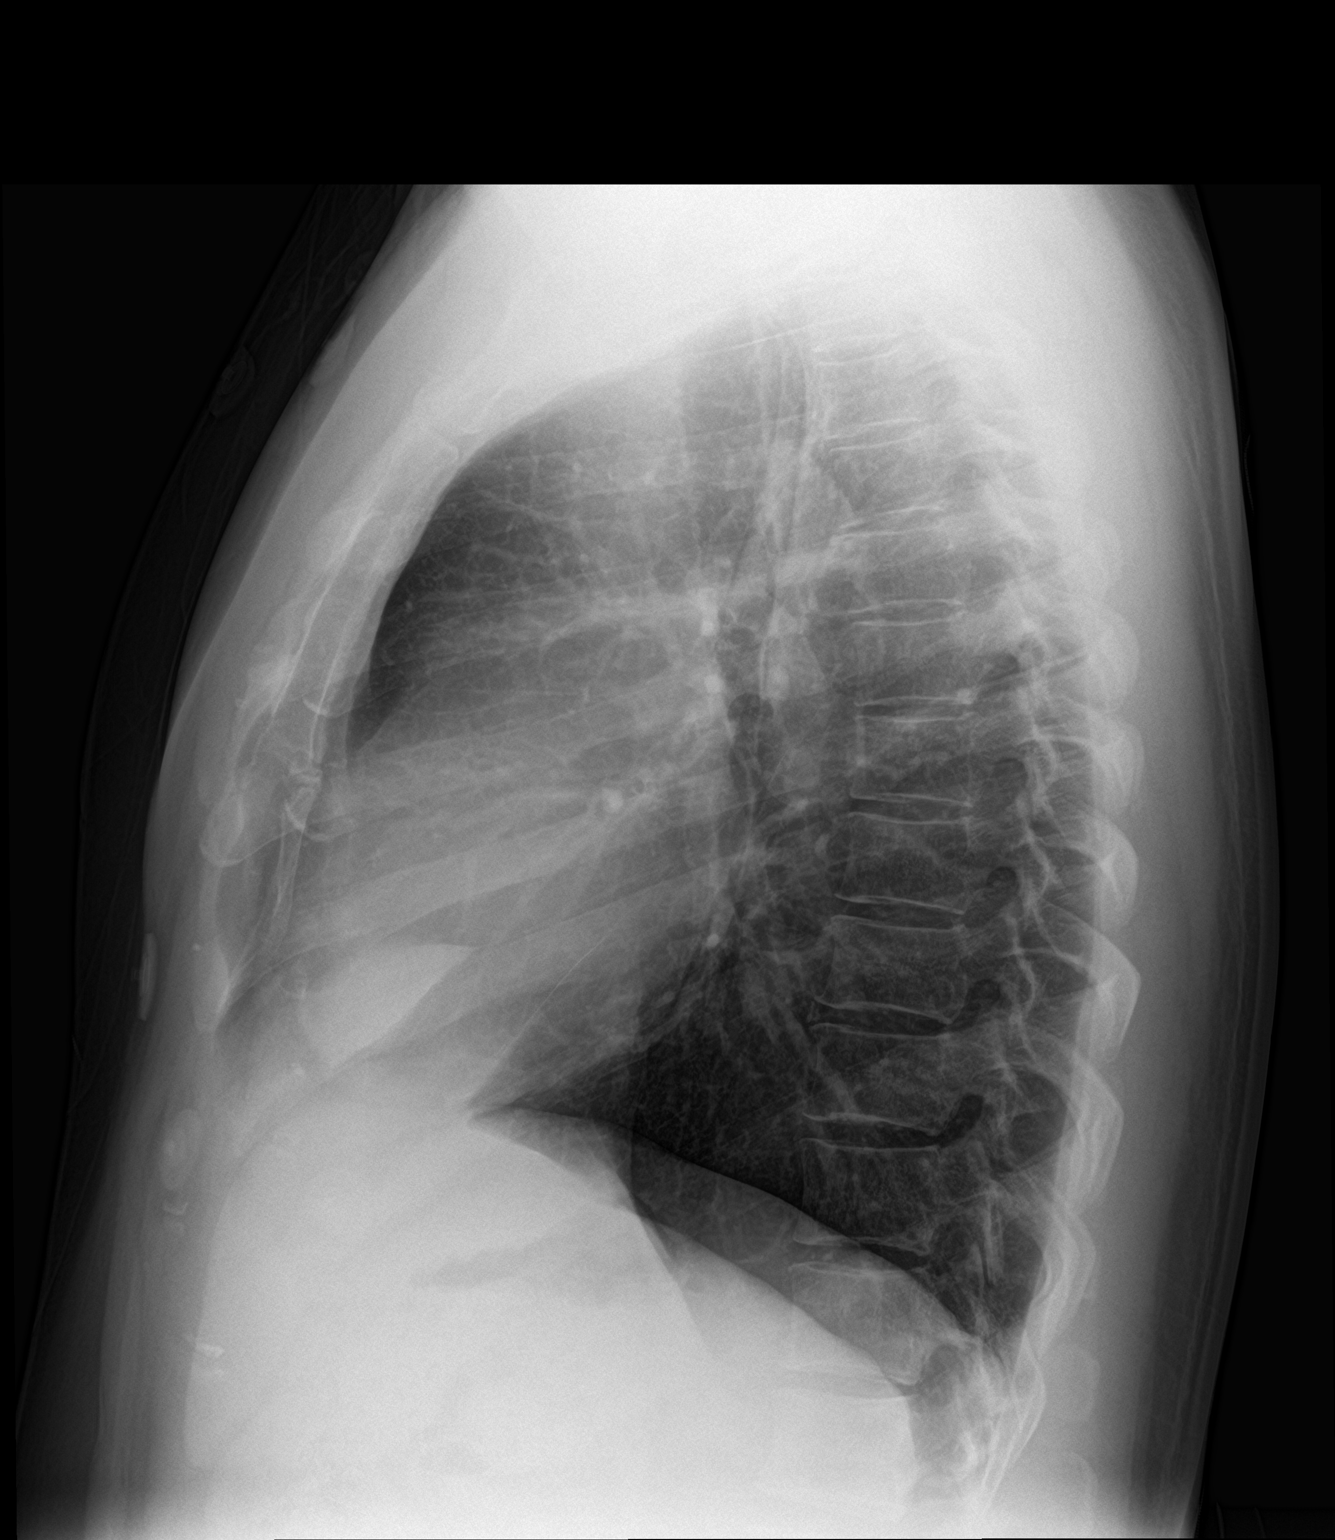

[2 of 2 positions shown; findings below may reference images not displayed]

FINDINGS: The cardiac silhouette, mediastinal and hilar contours are within
normal limits. The lungs are clear of infiltrate or edema. There is
a small amount of fluid noted in the right major fissure
IMPRESSION: Small amount of fluid noted in the right major fissure but no
infiltrates or edema.

## 2019-09-10 ENCOUNTER — Ambulatory Visit: Payer: 59 | Attending: Internal Medicine

## 2019-09-10 DIAGNOSIS — Z23 Encounter for immunization: Secondary | ICD-10-CM

## 2019-09-10 NOTE — Progress Notes (Signed)
   Covid-19 Vaccination Clinic  Name:  Kevin Navarro    MRN: [id number] DOB: 20-Nov-1975  09/10/2019  Kevin Navarro was observed post Covid-19 immunization for 15 minutes without incident. He was provided with Vaccine Information Sheet and instruction to access the V-Safe system.   Kevin Navarro was instructed to call 911 with any severe reactions post vaccine: Marland Kitchen Difficulty breathing  . Swelling of face and throat  . A fast heartbeat  . A bad rash all over body  . Dizziness and weakness   Immunizations Administered    Name Date Dose VIS Date Route   Pfizer COVID-19 Vaccine 09/10/2019  9:18 AM 0.3 mL 05/29/2019 Intramuscular   Manufacturer: [employer], [employer]   Lot: [id number]   NDC: [address]-7

## 2019-10-05 ENCOUNTER — Ambulatory Visit: Payer: 59 | Attending: Internal Medicine

## 2019-10-05 DIAGNOSIS — Z23 Encounter for immunization: Secondary | ICD-10-CM

## 2019-10-05 NOTE — Progress Notes (Signed)
   Covid-19 Vaccination Clinic  Name:  Kevin Navarro    MRN: [id number] DOB: 06-28-1975  10/05/2019  Kevin Navarro was observed post Covid-19 immunization for 30 minutes based on pre-vaccination screening without incident. He was provided with Vaccine Information Sheet and instruction to access the V-Safe system.   Kevin Navarro was instructed to call 911 with any severe reactions post vaccine: Marland Kitchen Difficulty breathing  . Swelling of face and throat  . A fast heartbeat  . A bad rash all over body  . Dizziness and weakness   Immunizations Administered    Name Date Dose VIS Date Route   Pfizer COVID-19 Vaccine 10/05/2019  9:12 AM 0.3 mL 08/12/2018 Intramuscular   Manufacturer: [employer], [employer]   Lot: [id number]   NDC: [address]-4

## 2019-10-18 ENCOUNTER — Other Ambulatory Visit: Payer: Self-pay | Admitting: Physician Assistant

## 2019-11-22 ENCOUNTER — Other Ambulatory Visit: Payer: Self-pay | Admitting: Physician Assistant

## 2019-11-24 ENCOUNTER — Telehealth: Payer: Self-pay | Admitting: Physician Assistant

## 2019-11-24 MED ORDER — METOPROLOL SUCCINATE ER 100 MG PO TB24: ORAL_TABLET | ORAL | 1 refills | Status: DC

## 2019-11-24 NOTE — Telephone Encounter (Signed)
Pt's medication was sent to pt's pharmacy as requested. Confirmation received.  °

## 2019-11-24 NOTE — Telephone Encounter (Signed)
Called patient and got no answer so called wife on DPR and spoke with her. She stated that patient is not good at doing what he is supposed to do and has not kept up with BP and said he will just keep getting refills from [place] without making an appointment after picking up last refill of toprol xl and seeing it say make appt to receive further refills. I told her I would not be refilling this without him making an appointment so wife stated she will get it through their PCP and call us back if PCP wont fill it. I asked her to let us know either way if PCP does or does not fill it and she stated she would. She thanked me for calling. Will forward to covering to make aware.

## 2019-11-24 NOTE — Telephone Encounter (Signed)
*  STAT* If patient is at the pharmacy, call can be transferred to refill team.   1. Which medications need to be refilled? (please list name of each medication and dose if known) metoprolol succinate (TOPROL-XL) 100 MG 24 hr tablet  2. Which pharmacy/location (including street and city if local pharmacy) is medication to be sent to? [id number] - [place], [place] - [address] AT [facility] OF [address]  3. Do they need a 30 day or 90 day supply? 30 day  Patient has an appointment 12/29/2019

## 2019-11-25 NOTE — Telephone Encounter (Signed)
Patient is scheduled with Jacolyn Reedy, PA on 12/29/19. Refill was sent in yesterday.

## 2019-12-16 NOTE — Progress Notes (Signed)
Cardiology Office Note    Date:  12/29/2019   ID:  Kevin Navarro, DOB March 01, 1976, MRN [id number]  PCP:  [facility]  Cardiologist: Verne Carrow, MD EPS: None  Chief Complaint  Patient presents with  . Follow-up    History of Present Illness:  Kevin Navarro is a 44 y.o. male hypertension, abnormal EKG with ST elevation in lead V2 in the setting of hypertensive urgency prompting cardiac catheterization 10/08/2016 show no evidence of CAD with myocardial bridging noted in the mid to distal LAD normal LVEF greater than 65%, no evidence of ascending aortic dissection. 2D echo showed mild basal hypertrophy of the septum LVEF 55 to 60% trace of MR and TR.   I saw the patient 09/17/2018 consistently elevated blood pressures and eating high salt diet. I increased his toprol 100 mg daily. Dr. Clifton James added amlodipine and told him to increase to 10 mg if still up after 1 week 01/23/19.  Patient comes in for f/u. BP doing much better on current meds. He is exercising-running 45-3min-4 miles and lifting weights almost daily. Changed diet some and tries to not to use a lot of salt.   Past Medical History:  Diagnosis Date  . Chest pain of uncertain etiology 10/08/2016  . Chest pain with moderate risk for cardiac etiology 10/09/2016  . H/O cardiac catheterization    a. done 10/08/16 showing no evidence of CAD, myocardial bridging noted in the mid to distal LAD, normal LV function EF >65%,   . HTN (hypertension)   . Hyperglycemia   . Hypokalemia   . Meniscus, medial, bucket handle tear, old 06/22/2016  . Myocardial bridge   . Precordial pain   . Status post arthroscopic surgery of left knee 07/07/2016    Past Surgical History:  Procedure Laterality Date  . KNEE ARTHROSCOPY WITH MEDIAL MENISECTOMY Left 06/22/2016   Procedure: LEFT KNEE ARTHROSCOPY WITH PARTIAL MEDIAL MENISCECTOMY;  Surgeon: Kathryne Hitch, MD;  Location: [facility];  Service: Orthopedics;   Laterality: Left;  . LEFT HEART CATH AND CORONARY ANGIOGRAPHY N/A 10/08/2016   Procedure: Left Heart Cath and Coronary Angiography;  Surgeon: Kathleene Hazel, MD;  Location: [facility] CV LAB;  Service: Cardiovascular;  Laterality: N/A;  . None      Current Medications: Current Meds  Medication Sig  . amLODipine (NORVASC) 5 MG tablet Take 1 tablet (5 mg total) by mouth daily.  . metoprolol succinate (TOPROL-XL) 100 MG 24 hr tablet TAKE 1 TABLET BY MOUTH DAILY. TAKE WITH OR IMMEDIATELY FOLLOWING A MEAL. Please keep upcoming in July for future refills. Thank you  . Multiple Vitamin (MULTIVITAMIN WITH MINERALS) TABS tablet Take 1 tablet by mouth daily.     Allergies:   Bee venom and Hydrocodone   Social History   Socioeconomic History  . Marital status: Married    Spouse name: Not on file  . Number of children: Not on file  . Years of education: Not on file  . Highest education level: Not on file  Occupational History  . Occupation: Corporate investment banker  Tobacco Use  . Smoking status: Former Smoker    Packs/day: 1.00    Years: 10.00    Pack years: 10.00    Types: Cigarettes    Quit date: 06/19/2011    Years since quitting: 8.5  . Smokeless tobacco: Never Used  Vaping Use  . Vaping Use: Never used  Substance and Sexual Activity  . Alcohol use: Yes    Alcohol/week: 5.0 standard drinks  Types: 5 Cans of beer per week    Comment: 1 beer a week  . Drug use: No  . Sexual activity: Yes    Birth control/protection: Condom, Coitus interruptus  Other Topics Concern  . Not on file  Social History Narrative   ** Merged History Encounter **       Social Determinants of Health   Financial Resource Strain:   . Difficulty of Paying Living Expenses:   Food Insecurity:   . Worried About Programme researcher, broadcasting/film/video in the Last Year:   . Barista in the Last Year:   Transportation Needs:   . Freight forwarder (Medical):   Marland Kitchen Lack of Transportation (Non-Medical):     Physical Activity:   . Days of Exercise per Week:   . Minutes of Exercise per Session:   Stress:   . Feeling of Stress :   Social Connections:   . Frequency of Communication with Friends and Family:   . Frequency of Social Gatherings with Friends and Family:   . Attends Religious Services:   . Active Member of Clubs or Organizations:   . Attends Banker Meetings:   Marland Kitchen Marital Status:      Family History:  The patient's family history includes CVA in his father; Hypertension in his mother.   ROS:   Please see the history of present illness.    ROS All other systems reviewed and are negative.   PHYSICAL EXAM:   VS:  BP 112/68   Pulse 62   Ht 5\' 6"  (1.676 m)   Wt 170 lb (77.1 kg)   BMI 27.44 kg/m   Physical Exam  GEN: Well nourished, well developed, in no acute distress  Neck: no JVD, carotid bruits, or masses Cardiac:RRR; no murmurs, rubs, or gallops  Respiratory:  clear to auscultation bilaterally, normal work of breathing GI: soft, nontender, nondistended, + BS Ext: without cyanosis, clubbing, or edema, Good distal pulses bilaterally Neuro:  Alert and Oriented x 3 Psych: euthymic mood, full affect  Wt Readings from Last 3 Encounters:  12/29/19 170 lb (77.1 kg)  01/22/19 170 lb (77.1 kg)  09/17/18 170 lb (77.1 kg)      Studies/Labs Reviewed:   EKG:  EKG is  ordered today.  The ekg ordered today demonstrates NSR with Incomplete RBBB, no change.  Recent Labs: 01/22/2019: BUN 14; Creatinine, Ser 0.82; Hemoglobin 13.8; Platelets 274; Potassium 3.8; Sodium 139   Lipid Panel    Component Value Date/Time   CHOL 175 01/08/2018 0949   TRIG 91 01/08/2018 0949   HDL 43 01/08/2018 0949   CHOLHDL 4.1 01/08/2018 0949   CHOLHDL 4.2 10/09/2016 1101   VLDL 48 (H) 10/09/2016 1101   LDLCALC 114 (H) 01/08/2018 0949    Additional studies/ records that were reviewed today include:    Echo 09/2016 study Conclusions   - Left ventricle: The cavity size was normal.  There was mild focal   basal hypertrophy of the septum. Systolic function was normal.   The estimated ejection fraction was in the range of 55% to 60%.   Wall motion was normal; there were no regional wall motion   abnormalities. Left ventricular diastolic function parameters   were normal.   Impressions:   - Normal LV systolic and diastolic function; trace MR and TR.    Cardiac cath 4/23/2018The left ventricular systolic function is normal.  LV end diastolic pressure is normal.  The left ventricular ejection fraction is greater  than 65% by visual estimate.  There is no mitral valve regurgitation.   1. No angiographic evidence of CAD 2. Myocardial bridging noted in the mid to distal LAD 3. Normal LV systolic function 4. No evidence of ascending aortic dissection   Recommendations: Will monitor on telemetry tonight. Echo in am to exclude pericardial effusion and structural heart disease.          ASSESSMENT:    1. Essential hypertension   2. Myocardial bridge   3. Hyperlipidemia, unspecified hyperlipidemia type      PLAN:  In order of problems listed above:     Essential hypertension patient's blood pressure  Much better controlled on metoprolol and amlodipine. Watching diet more closely but still eating out a lot. Exercising daily.continue current meds.   Myocardial bridge in the mid to distal LAD with normal LVEF and no evidence of aortic dissection at cardiac cath 2018, 2D echo normal LVEF 55 to 60% with a trace of MR and TR mild basal hypertrophy of the septum.  No chest pain   Hyperlipidemia LDL 114 total cholesterol 585 12/2017 will repeat  Tomorrow        Medication Adjustments/Labs and Tests Ordered: Current medicines are reviewed at length with the patient today.  Concerns regarding medicines are outlined above.  Medication changes, Labs and Tests ordered today are listed in the Patient Instructions below. Patient Instructions  Medication Instructions:    Your physician recommends that you continue on your current medications as directed. Please refer to the Current Medication list given to you today.  *If you need a refill on your cardiac medications before your next appointment, please call your pharmacy*   Lab Work: Lipids, CMET, CBC Tomorrow Your physician recommends that you return for a FASTING lipid profile:   If you have labs (blood work) drawn today and your tests are completely normal, you will receive your results only by: Marland Kitchen MyChart Message (if you have MyChart) OR . A paper copy in the mail If you have any lab test that is abnormal or we need to change your treatment, we will call you to review the results.   Testing/Procedures: None   Follow-Up: At [facility], you and your health needs are our priority.  As part of our continuing mission to provide you with exceptional heart care, we have created designated Provider Care Teams.  These Care Teams include your primary Cardiologist (physician) and Advanced Practice Providers (APPs -  Physician Assistants and Nurse Practitioners) who all work together to provide you with the care you need, when you need it.  We recommend signing up for the patient portal called "MyChart".  Sign up information is provided on this After Visit Summary.  MyChart is used to connect with patients for Virtual Visits (Telemedicine).  Patients are able to view lab/test results, encounter notes, upcoming appointments, etc.  Non-urgent messages can be sent to your provider as well.   To learn more about what you can do with MyChart, go to [email or web address].    Your next appointment:   12 month(s)  The format for your next appointment:   In Person  Provider:   You may see Verne Carrow, MD or one of the following Advanced Practice Providers on your designated Care Team:    Ronie Spies, PA-C  Jacolyn Reedy, PA-C    Other Instructions None     Signed, Jacolyn Reedy, PA-C   12/29/2019 9:48 AM    [facility] [address]  [address], [place], [place]  [address] Phone: [phone]; Fax: [phone]

## 2019-12-29 ENCOUNTER — Encounter: Payer: Self-pay | Admitting: Physician Assistant

## 2019-12-29 ENCOUNTER — Ambulatory Visit: Payer: 59 | Admitting: Physician Assistant

## 2019-12-29 ENCOUNTER — Other Ambulatory Visit: Payer: Self-pay

## 2019-12-29 VITALS — BP 112/68 | HR 62 | Ht 66.0 in | Wt 170.0 lb

## 2019-12-29 DIAGNOSIS — Q245 Malformation of coronary vessels: Secondary | ICD-10-CM

## 2019-12-29 DIAGNOSIS — I10 Essential (primary) hypertension: Secondary | ICD-10-CM | POA: Diagnosis not present

## 2019-12-29 DIAGNOSIS — E785 Hyperlipidemia, unspecified: Secondary | ICD-10-CM | POA: Diagnosis not present

## 2019-12-29 MED ORDER — METOPROLOL SUCCINATE ER 100 MG PO TB24: ORAL_TABLET | ORAL | 1 refills | Status: DC

## 2019-12-29 MED ORDER — AMLODIPINE BESYLATE 5 MG PO TABS: 5.0000 mg | ORAL_TABLET | Freq: Every day | ORAL | 1 refills | Status: DC

## 2019-12-29 NOTE — Patient Instructions (Signed)
Medication Instructions:  Your physician recommends that you continue on your current medications as directed. Please refer to the Current Medication list given to you today.  *If you need a refill on your cardiac medications before your next appointment, please call your pharmacy*   Lab Work: Lipids, CMET, CBC Tomorrow Your physician recommends that you return for a FASTING lipid profile:   If you have labs (blood work) drawn today and your tests are completely normal, you will receive your results only by: Marland Kitchen MyChart Message (if you have MyChart) OR . A paper copy in the mail If you have any lab test that is abnormal or we need to change your treatment, we will call you to review the results.   Testing/Procedures: None   Follow-Up: At [facility], you and your health needs are our priority.  As part of our continuing mission to provide you with exceptional heart care, we have created designated Provider Care Teams.  These Care Teams include your primary Cardiologist (physician) and Advanced Practice Providers (APPs -  Physician Assistants and Nurse Practitioners) who all work together to provide you with the care you need, when you need it.  We recommend signing up for the patient portal called "MyChart".  Sign up information is provided on this After Visit Summary.  MyChart is used to connect with patients for Virtual Visits (Telemedicine).  Patients are able to view lab/test results, encounter notes, upcoming appointments, etc.  Non-urgent messages can be sent to your provider as well.   To learn more about what you can do with MyChart, go to [email or web address].    Your next appointment:   12 month(s)  The format for your next appointment:   In Person  Provider:   You may see Verne Carrow, MD or one of the following Advanced Practice Providers on your designated Care Team:    Ronie Spies, PA-C  Jacolyn Reedy, PA-C    Other Instructions None

## 2019-12-29 NOTE — Addendum Note (Signed)
Addended by: Cleda Mccreedy on: 12/29/2019 09:54 AM   Modules accepted: Orders

## 2019-12-30 ENCOUNTER — Other Ambulatory Visit: Payer: 59 | Admitting: *Deleted

## 2019-12-30 DIAGNOSIS — Q245 Malformation of coronary vessels: Secondary | ICD-10-CM

## 2019-12-30 DIAGNOSIS — E785 Hyperlipidemia, unspecified: Secondary | ICD-10-CM

## 2019-12-30 DIAGNOSIS — I10 Essential (primary) hypertension: Secondary | ICD-10-CM

## 2019-12-30 LAB — COMPREHENSIVE METABOLIC PANEL
ALT: 32 IU/L (ref 0–44)
AST: 20 IU/L (ref 0–40)
Albumin/Globulin Ratio: 1.6 (ref 1.2–2.2)
Albumin: 4.5 g/dL (ref 4.0–5.0)
Alkaline Phosphatase: 74 IU/L (ref 48–121)
BUN/Creatinine Ratio: 18 (ref 9–20)
BUN: 15 mg/dL (ref 6–24)
Bilirubin Total: 0.6 mg/dL (ref 0.0–1.2)
CO2: 23 mmol/L (ref 20–29)
Calcium: 9 mg/dL (ref 8.7–10.2)
Chloride: 104 mmol/L (ref 96–106)
Creatinine, Ser: 0.85 mg/dL (ref 0.76–1.27)
GFR calc Af Amer: 124 mL/min/{1.73_m2} (ref >59)
GFR calc non Af Amer: 107 mL/min/{1.73_m2} (ref >59)
Globulin, Total: 2.8 g/dL (ref 1.5–4.5)
Glucose: 97 mg/dL (ref 65–99)
Potassium: 4.6 mmol/L (ref 3.5–5.2)
Sodium: 140 mmol/L (ref 134–144)
Total Protein: 7.3 g/dL (ref 6.0–8.5)

## 2019-12-30 LAB — LIPID PANEL
Chol/HDL Ratio: 3.9 ratio (ref 0.0–5.0)
Cholesterol, Total: 181 mg/dL (ref 100–199)
HDL: 46 mg/dL (ref >39)
LDL Chol Calc (NIH): 120 mg/dL — ABNORMAL HIGH (ref 0–99)
Triglycerides: 81 mg/dL (ref 0–149)
VLDL Cholesterol Cal: 15 mg/dL (ref 5–40)

## 2019-12-30 LAB — CBC
Hematocrit: 42.1 % (ref 37.5–51.0)
Hemoglobin: 14.3 g/dL (ref 13.0–17.7)
MCH: 29.9 pg (ref 26.6–33.0)
MCHC: 34 g/dL (ref 31.5–35.7)
MCV: 88 fL (ref 79–97)
Platelets: 250 10*3/uL (ref 150–450)
RBC: 4.78 x10E6/uL (ref 4.14–5.80)
RDW: 12.6 % (ref 11.6–15.4)
WBC: 8.1 10*3/uL (ref 3.4–10.8)

## 2020-07-07 ENCOUNTER — Other Ambulatory Visit: Payer: Self-pay | Admitting: Physician Assistant

## 2020-08-07 ENCOUNTER — Other Ambulatory Visit: Payer: Self-pay | Admitting: Physician Assistant

## 2021-01-05 ENCOUNTER — Other Ambulatory Visit: Payer: Self-pay | Admitting: Physician Assistant

## 2021-02-01 ENCOUNTER — Other Ambulatory Visit: Payer: Self-pay | Admitting: Physician Assistant

## 2021-02-03 ENCOUNTER — Other Ambulatory Visit: Payer: Self-pay | Admitting: Physician Assistant

## 2021-02-09 ENCOUNTER — Telehealth: Payer: Self-pay | Admitting: Physician Assistant

## 2021-02-09 MED ORDER — METOPROLOL SUCCINATE ER 100 MG PO TB24: ORAL_TABLET | ORAL | 0 refills | Status: DC

## 2021-02-09 MED ORDER — AMLODIPINE BESYLATE 5 MG PO TABS: 5.0000 mg | ORAL_TABLET | Freq: Every day | ORAL | 0 refills | Status: DC

## 2021-02-09 NOTE — Telephone Encounter (Signed)
Refill for Amlodipine and Metoprolol has been sent to [facility].

## 2021-02-09 NOTE — Telephone Encounter (Signed)
  *  STAT* If patient is at the pharmacy, call can be transferred to refill team.   1. Which medications need to be refilled? (please list name of each medication and dose if known)  metoprolol succinate (TOPROL-XL) 100 MG 24 hr tablet amLODipine (NORVASC) 5 MG tablet  2. Which pharmacy/location (including street and city if local pharmacy) is medication to be sent to? [facility] [id number] [place], [place] - [address]  3. Do they need a 30 day or 90 day supply? 90 days   Pt made an appt with Herma Carson on 06/20/20. Pt is out of meds needs refill

## 2021-02-26 ENCOUNTER — Other Ambulatory Visit: Payer: Self-pay | Admitting: Physician Assistant

## 2021-05-27 ENCOUNTER — Other Ambulatory Visit: Payer: Self-pay | Admitting: Physician Assistant

## 2021-06-07 NOTE — Progress Notes (Deleted)
Cardiology Office Note    Date:  06/07/2021   ID:  Kevin Navarro, DOB 04-13-1976, MRN [id number]   PCP:  [facility], [facility]   [facility]  Cardiologist:  Verne Carrow, MD   Advanced Practice Provider:  No care team member to display Electrophysiologist:  None   [id number]}   No chief complaint on file.   History of Present Illness:  Kevin Navarro is a 45 y.o. 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 2D echo showed mild basal hypertrophy of the septum LVEF 55 to 60% trace of MR and TR.    I saw the patient 09/17/2018 consistently elevated blood pressures and eating high salt diet. I increased his toprol 100 mg daily. Dr. Clifton James added amlodipine and told him to increase to 10 mg if still up after 1 week 01/23/19.  I saw the patient 12/2019 and BP much better controlled. Changed his diet and started exercising.      Past Medical History:  Diagnosis Date   Chest pain of uncertain etiology 10/08/2016   Chest pain with moderate risk for cardiac etiology 10/09/2016   H/O cardiac catheterization    a. done 10/08/16 showing no evidence of CAD, myocardial bridging noted in the mid to distal LAD, normal LV function EF >65%,    HTN (hypertension)    Hyperglycemia    Hypokalemia    Meniscus, medial, bucket handle tear, old 06/22/2016   Myocardial bridge    Precordial pain    Status post arthroscopic surgery of left knee 07/07/2016    Past Surgical History:  Procedure Laterality Date   KNEE ARTHROSCOPY WITH MEDIAL MENISECTOMY Left 06/22/2016   Procedure: LEFT KNEE ARTHROSCOPY WITH PARTIAL MEDIAL MENISCECTOMY;  Surgeon: Kathryne Hitch, MD;  Location: [facility];   Service: Orthopedics;  Laterality: Left;   LEFT HEART CATH AND CORONARY ANGIOGRAPHY N/A 10/08/2016   Procedure: Left Heart Cath and Coronary Angiography;  Surgeon: Kathleene Hazel, MD;  Location: [facility] INVASIVE CV LAB;  Service: Cardiovascular;  Laterality: N/A;   None      Current Medications: No outpatient medications have been marked as taking for the 06/20/21 encounter (Appointment) with Dyann Kief, PA-C.     Allergies:   Bee venom and Hydrocodone   Social History   Socioeconomic History   Marital status: Married    Spouse name: Not on file   Number of children: Not on file   Years of education: Not on file   Highest education level: Not on file  Occupational History   Occupation: Corporate investment banker  Tobacco Use   Smoking status: Former    Packs/day: 1.00    Years: 10.00    Pack years: 10.00    Types: Cigarettes    Quit date: 06/19/2011    Years since quitting: 9.9   Smokeless tobacco: Never  Vaping Use   Vaping Use: Never used  Substance and Sexual Activity   Alcohol use: Yes    Alcohol/week: 5.0 standard drinks    Types: 5 Cans of beer per week    Comment: 1 beer a week   Drug use: No   Sexual activity: Yes    Birth control/protection: Condom, Coitus interruptus  Other Topics Concern   Not  on file  Social History Narrative   ** Merged History Encounter **       Social Determinants of Health   Financial Resource Strain: Not on file  Food Insecurity: Not on file  Transportation Needs: Not on file  Physical Activity: Not on file  Stress: Not on file  Social Connections: Not on file     Family History:  The patient's ***family history includes CVA in his father; Hypertension in his mother.   ROS:   Please see the history of present illness.    ROS All other systems reviewed and are negative.   PHYSICAL EXAM:   VS:  There were no vitals taken for this visit.  Physical Exam  GEN: Well nourished, well developed, in no acute distress  HEENT:  normal  Neck: no JVD, carotid bruits, or masses Cardiac:RRR; no murmurs, rubs, or gallops  Respiratory:  clear to auscultation bilaterally, normal work of breathing GI: soft, nontender, nondistended, + BS Ext: without cyanosis, clubbing, or edema, Good distal pulses bilaterally MS: no deformity or atrophy  Skin: warm and dry, no rash Neuro:  Alert and Oriented x 3, Strength and sensation are intact Psych: euthymic mood, full affect  Wt Readings from Last 3 Encounters:  12/29/19 170 lb (77.1 kg)  01/22/19 170 lb (77.1 kg)  09/17/18 170 lb (77.1 kg)      Studies/Labs Reviewed:   EKG:  EKG is*** ordered today.  The ekg ordered today demonstrates ***  Recent Labs: No results found for requested labs within last 8760 hours.   Lipid Panel    Component Value Date/Time   CHOL 181 12/30/2019 0815   TRIG 81 12/30/2019 0815   HDL 46 12/30/2019 0815   CHOLHDL 3.9 12/30/2019 0815   CHOLHDL 4.2 10/09/2016 1101   VLDL 48 (H) 10/09/2016 1101   LDLCALC 120 (H) 12/30/2019 0815    Additional studies/ records that were reviewed today include:     Echo 09/2016 study Conclusions   - Left ventricle: The cavity size was normal. There was mild focal   basal hypertrophy of the septum. Systolic function was normal.   The estimated ejection fraction was in the range of 55% to 60%.   Wall motion was normal; there were no regional wall motion   abnormalities. Left ventricular diastolic function parameters   were normal.   Impressions:   - Normal LV systolic and diastolic function; trace MR and TR.   Cardiac cath 4/23/2018The left ventricular systolic function is normal. LV end diastolic pressure is normal. The left ventricular ejection fraction is greater than 65% by visual estimate. There is no mitral valve regurgitation.   1. No angiographic evidence of CAD 2. Myocardial bridging noted in the mid to distal LAD 3. Normal LV systolic function 4. No evidence of ascending aortic  dissection   Recommendations: Will monitor on telemetry tonight. Echo in am to exclude pericardial effusion and structural heart disease.        Risk Assessment/Calculations:   {Does this patient have ATRIAL FIBRILLATION?:[phone]}     ASSESSMENT:    No diagnosis found.   PLAN:  In order of problems listed above:     Essential hypertension patient's blood pressure  Much better controlled on metoprolol and amlodipine. Watching diet more closely but still eating out a lot. Exercising daily.continue current meds.   Myocardial bridge in the mid to distal LAD with normal LVEF and no evidence of aortic dissection at cardiac cath 2018, 2D echo normal LVEF 55  to 60% with a trace of MR and TR mild basal hypertrophy of the septum.  No chest pain   Hyperlipidemia LDL           Shared Decision Making/Informed Consent   {Are you ordering a CV Procedure (e.g. stress test, cath, DCCV, TEE, etc)?   Press F2        :[id number]}    Medication Adjustments/Labs and Tests Ordered: Current medicines are reviewed at length with the patient today.  Concerns regarding medicines are outlined above.  Medication changes, Labs and Tests ordered today are listed in the Patient Instructions below. There are no Patient Instructions on file for this visit.   Elson Clan, PA-C  06/07/2021 9:09 AM    [facility] [address] [place], [place], [place]  [address] Phone: [phone]; Fax: [phone]

## 2021-06-20 ENCOUNTER — Ambulatory Visit: Payer: 59 | Admitting: Physician Assistant

## 2021-08-08 ENCOUNTER — Telehealth: Payer: Self-pay | Admitting: Cardiovascular Disease

## 2021-08-08 MED ORDER — METOPROLOL SUCCINATE ER 100 MG PO TB24: ORAL_TABLET | ORAL | 0 refills | Status: DC

## 2021-08-08 NOTE — Telephone Encounter (Signed)
Pt's medication was sent to pt's pharmacy as requested. Confirmation received.  °

## 2021-08-08 NOTE — Telephone Encounter (Signed)
°*  STAT* If patient is at the pharmacy, call can be transferred to refill team.   1. Which medications need to be refilled? (please list name of each medication and dose if known)  metoprolol succinate (TOPROL-XL) 100 MG 24 hr tablet  2. Which pharmacy/location (including street and city if local pharmacy) is medication to be sent to? [facility] [id number] [place], [place] - [address]  3. Do they need a 30 day or 90 day supply?    Patient is scheduled for tomorrow, 2/22 with Jacolyn Reedy, PA, but he is completely out of medication. He is requesting enough medication to last him until his appointment

## 2021-08-09 ENCOUNTER — Encounter: Payer: Self-pay | Admitting: Physician Assistant

## 2021-08-09 ENCOUNTER — Ambulatory Visit: Payer: 59 | Admitting: Physician Assistant

## 2021-08-09 ENCOUNTER — Other Ambulatory Visit: Payer: Self-pay

## 2021-08-09 VITALS — BP 120/80 | HR 72 | Ht 66.0 in | Wt 175.0 lb

## 2021-08-09 DIAGNOSIS — E785 Hyperlipidemia, unspecified: Secondary | ICD-10-CM | POA: Diagnosis not present

## 2021-08-09 DIAGNOSIS — I10 Essential (primary) hypertension: Secondary | ICD-10-CM | POA: Diagnosis not present

## 2021-08-09 DIAGNOSIS — Q245 Malformation of coronary vessels: Secondary | ICD-10-CM | POA: Diagnosis not present

## 2021-08-09 MED ORDER — AMLODIPINE BESYLATE 5 MG PO TABS: 5.0000 mg | ORAL_TABLET | Freq: Every day | ORAL | 3 refills | Status: DC

## 2021-08-09 MED ORDER — METOPROLOL SUCCINATE ER 100 MG PO TB24: 100.0000 mg | ORAL_TABLET | Freq: Every day | ORAL | 3 refills | Status: DC

## 2021-08-09 NOTE — Progress Notes (Signed)
Cardiology Office Note    Date:  08/09/2021   ID:  Deluca Cloos, DOB 1975-06-22, MRN [id number]   PCP:  [facility], [facility]   [facility]  Cardiologist:  Verne Carrow, MD   Advanced Practice Provider:  No care team member to display Electrophysiologist:  None   [id number]}   Chief Complaint  Patient presents with   Follow-up    History of Present Illness:  Kevin Navarro is a 46 y.o. male with history of hypertension, former smoker-quit 5 yrs ago(5 pk years), abnormal EKG with ST elevation in lead V2 in the setting of hypertensive urgency prompting cardiac catheterization 10/08/2016 show no evidence of CAD with myocardial bridging noted in the mid to distal LAD normal LVEF greater than 65%, no evidence of ascending aortic dissection. 2D echo showed mild basal hypertrophy of the septum LVEF 55 to 60% trace of MR and TR.    I saw the patient 09/17/2018 consistently elevated blood pressures and eating high salt diet. I increased his toprol 100 mg daily. Dr. Clifton James added amlodipine and told him to increase to 10 mg if still up after 1 week 01/23/19.   I last saw the patient 12/29/19 and doing better after changing diet and exercise.  Patient comes in for f/u. He ran out of BP meds yest and BP was high and dizzy. Got his meds and felt better after taking. Has been taking meds daily and BP well controlled. Stopped exercising and has gained weight-was down to 160 at one point. Went to the gym this am and getting back on track. Doesn't eat as well as he should. Had an allergy shot yest.   Past Medical History:  Diagnosis Date   Chest pain of uncertain etiology 10/08/2016   Chest pain with moderate risk for cardiac etiology 10/09/2016   H/O cardiac catheterization    a. done 10/08/16 showing no evidence of CAD, myocardial bridging noted in the mid to distal LAD, normal LV function EF >65%,    HTN (hypertension)    Hyperglycemia     Hypokalemia    Meniscus, medial, bucket handle tear, old 06/22/2016   Myocardial bridge    Precordial pain    Status post arthroscopic surgery of left knee 07/07/2016    Past Surgical History:  Procedure Laterality Date   KNEE ARTHROSCOPY WITH MEDIAL MENISECTOMY Left 06/22/2016   Procedure: LEFT KNEE ARTHROSCOPY WITH PARTIAL MEDIAL MENISCECTOMY;  Surgeon: Kathryne Hitch, MD;  Location: [facility];  Service: Orthopedics;  Laterality: Left;   LEFT HEART CATH AND CORONARY ANGIOGRAPHY N/A 10/08/2016   Procedure: Left Heart Cath and Coronary Angiography;  Surgeon: Kathleene Hazel, MD;  Location: [facility] INVASIVE CV LAB;  Service: Cardiovascular;  Laterality: N/A;   None      Current Medications: Current Meds  Medication Sig   EPINEPHrine 0.3 mg/0.3 mL IJ SOAJ injection 0.3 Milligram(s) IM Once PRN   Multiple Vitamin (MULTIVITAMIN WITH MINERALS) TABS tablet Take 1 tablet by mouth daily.   [DISCONTINUED] amLODipine (NORVASC) 5 MG tablet TAKE ONE TABLET BY MOUTH DAILY *CALL TO SCHEDULE APPOINTMENT FOR FURTHER REFILLS*   [DISCONTINUED] metoprolol succinate (TOPROL-XL) 100 MG 24 hr tablet TAKE ONE TABLET BY MOUTH DAILY WITH OR IMMEDIATELY FOLLOWING A MEAL.     Allergies:   Bee venom and Hydrocodone   Social History   Socioeconomic History   Marital status: Married    Spouse name: Not on file   Number of children: Not on file  Years of education: Not on file   Highest education level: Not on file  Occupational History   Occupation: Corporate investment banker  Tobacco Use   Smoking status: Former    Packs/day: 1.00    Years: 10.00    Pack years: 10.00    Types: Cigarettes    Quit date: 06/19/2011    Years since quitting: 10.1   Smokeless tobacco: Never  Vaping Use   Vaping Use: Never used  Substance and Sexual Activity   Alcohol use: Yes    Alcohol/week: 5.0 standard drinks    Types: 5 Cans of beer per week    Comment: 1 beer a week   Drug use: No   Sexual activity: Yes    Birth  control/protection: Condom, Coitus interruptus  Other Topics Concern   Not on file  Social History Narrative   ** Merged History Encounter **       Social Determinants of Health   Financial Resource Strain: Not on file  Food Insecurity: Not on file  Transportation Needs: Not on file  Physical Activity: Not on file  Stress: Not on file  Social Connections: Not on file     Family History:  The patient's  family history includes CVA (age of onset: 26) in his father; Hypertension in his mother.   ROS:   Please see the history of present illness.    ROS All other systems reviewed and are negative.   PHYSICAL EXAM:   VS:  BP 120/80    Pulse 72    Ht 5\' 6"  (1.676 m)    Wt 175 lb (79.4 kg)    SpO2 100%    BMI 28.25 kg/m   Physical Exam  GEN: Well nourished, well developed, in no acute distress  Neck: no JVD, carotid bruits, or masses Cardiac:RRR; no murmurs, rubs, or gallops  Respiratory:  clear to auscultation bilaterally, normal work of breathing GI: soft, nontender, nondistended, + BS Ext: without cyanosis, clubbing, or edema, Good distal pulses bilaterally Neuro:  Alert and Oriented x 3 Psych: euthymic mood, full affect  Wt Readings from Last 3 Encounters:  08/09/21 175 lb (79.4 kg)  12/29/19 170 lb (77.1 kg)  01/22/19 170 lb (77.1 kg)      Studies/Labs Reviewed:   EKG:  EKG is  ordered today.  The ekg ordered today demonstrates NSR IRBBB no change  Recent Labs: No results found for requested labs within last 8760 hours.   Lipid Panel    Component Value Date/Time   CHOL 181 12/30/2019 0815   TRIG 81 12/30/2019 0815   HDL 46 12/30/2019 0815   CHOLHDL 3.9 12/30/2019 0815   CHOLHDL 4.2 10/09/2016 1101   VLDL 48 (H) 10/09/2016 1101   LDLCALC 120 (H) 12/30/2019 0815    Additional studies/ records that were reviewed today include:  Echo 09/2016 study Conclusions   - Left ventricle: The cavity size was normal. There was mild focal   basal hypertrophy of the  septum. Systolic function was normal.   The estimated ejection fraction was in the range of 55% to 60%.   Wall motion was normal; there were no regional wall motion   abnormalities. Left ventricular diastolic function parameters   were normal.   Impressions:   - Normal LV systolic and diastolic function; trace MR and TR.   Cardiac cath 4/23/2018The left ventricular systolic function is normal. LV end diastolic pressure is normal. The left ventricular ejection fraction is greater than 65% by visual estimate. There  is no mitral valve regurgitation.   1. No angiographic evidence of CAD 2. Myocardial bridging noted in the mid to distal LAD 3. Normal LV systolic function 4. No evidence of ascending aortic dissection   Recommendations: Will monitor on telemetry tonight. Echo in am to exclude pericardial effusion and structural heart disease.                Risk Assessment/Calculations:         ASSESSMENT:    1. Essential hypertension   2. Myocardial bridge   3. Hyperlipidemia, unspecified hyperlipidemia type      PLAN:  In order of problems listed above:  Essential hypertension    Myocardial bridge in the mid to distal LAD with normal LVEF and no evidence of aortic dissection at cardiac cath 2018, 2D echo normal LVEF 55 to 60% with a trace of MR and TR mild basal hypertrophy of the septum.  No chest pain   Hyperlipidemia LDL        Shared Decision Making/Informed Consent        Medication Adjustments/Labs and Tests Ordered: Current medicines are reviewed at length with the patient today.  Concerns regarding medicines are outlined above.  Medication changes, Labs and Tests ordered today are listed in the Patient Instructions below. Patient Instructions  Medication Instructions:  Your physician recommends that you continue on your current medications as directed. Please refer to the Current Medication list given to you today.  *If you need a refill on your  cardiac medications before your next appointment, please call your pharmacy*   Lab Work: TODAY: CMET, CBC, TSH, FLP  If you have labs (blood work) drawn today and your tests are completely normal, you will receive your results only by: MyChart Message (if you have MyChart) OR A paper copy in the mail If you have any lab test that is abnormal or we need to change your treatment, we will call you to review the results.   Follow-Up: At [facility], you and your health needs are our priority.  As part of our continuing mission to provide you with exceptional heart care, we have created designated Provider Care Teams.  These Care Teams include your primary Cardiologist (physician) and Advanced Practice Providers (APPs -  Physician Assistants and Nurse Practitioners) who all work together to provide you with the care you need, when you need it.   Your next appointment:   1 year(s)  The format for your next appointment:   In Person  Provider:   Verne Carrow, MD       Signed, Jacolyn Reedy, PA-C  08/09/2021 11:45 AM    [facility] [address] [place], [place], [place]  [address] Phone: [phone]; Fax: [phone]

## 2021-08-09 NOTE — Patient Instructions (Signed)
Medication Instructions:  Your physician recommends that you continue on your current medications as directed. Please refer to the Current Medication list given to you today.  *If you need a refill on your cardiac medications before your next appointment, please call your pharmacy*   Lab Work: TODAY: CMET, CBC, TSH, FLP  If you have labs (blood work) drawn today and your tests are completely normal, you will receive your results only by: MyChart Message (if you have MyChart) OR A paper copy in the mail If you have any lab test that is abnormal or we need to change your treatment, we will call you to review the results.   Follow-Up: At [facility], you and your health needs are our priority.  As part of our continuing mission to provide you with exceptional heart care, we have created designated Provider Care Teams.  These Care Teams include your primary Cardiologist (physician) and Advanced Practice Providers (APPs -  Physician Assistants and Nurse Practitioners) who all work together to provide you with the care you need, when you need it.   Your next appointment:   1 year(s)  The format for your next appointment:   In Person  Provider:   Verne Carrow, MD

## 2021-08-10 LAB — SPECIMEN STATUS REPORT

## 2021-08-11 ENCOUNTER — Telehealth: Payer: Self-pay | Admitting: Cardiovascular Disease

## 2021-08-11 ENCOUNTER — Other Ambulatory Visit: Payer: Self-pay

## 2021-08-11 ENCOUNTER — Emergency Department (HOSPITAL_BASED_OUTPATIENT_CLINIC_OR_DEPARTMENT_OTHER)
Admission: EM | Admit: 2021-08-11 | Discharge: 2021-08-12 | Disposition: A | Discharge status: Home / Self Care | Payer: 59 | Attending: Emergency Medicine | Admitting: Emergency Medicine

## 2021-08-11 ENCOUNTER — Encounter (HOSPITAL_BASED_OUTPATIENT_CLINIC_OR_DEPARTMENT_OTHER): Payer: Self-pay | Admitting: *Deleted

## 2021-08-11 DIAGNOSIS — I10 Essential (primary) hypertension: Secondary | ICD-10-CM | POA: Diagnosis not present

## 2021-08-11 DIAGNOSIS — Z79899 Other long term (current) drug therapy: Secondary | ICD-10-CM | POA: Diagnosis not present

## 2021-08-11 DIAGNOSIS — R001 Bradycardia, unspecified: Secondary | ICD-10-CM | POA: Diagnosis present

## 2021-08-11 LAB — CBC
HCT: 44.8 % (ref 39.0–52.0)
Hemoglobin: 14.9 g/dL (ref 13.0–17.0)
MCH: 29.6 pg (ref 26.0–34.0)
MCHC: 33.3 g/dL (ref 30.0–36.0)
MCV: 88.9 fL (ref 80.0–100.0)
Platelets: 270 10*3/uL (ref 150–400)
RBC: 5.04 MIL/uL (ref 4.22–5.81)
RDW: 12.3 % (ref 11.5–15.5)
WBC: 10.6 10*3/uL — ABNORMAL HIGH (ref 4.0–10.5)
nRBC: 0 % (ref 0.0–0.2)

## 2021-08-11 LAB — BASIC METABOLIC PANEL
Anion gap: 7 (ref 5–15)
BUN: 13 mg/dL (ref 6–20)
CO2: 28 mmol/L (ref 22–32)
Calcium: 9 mg/dL (ref 8.9–10.3)
Chloride: 103 mmol/L (ref 98–111)
Creatinine, Ser: 0.79 mg/dL (ref 0.61–1.24)
GFR, Estimated: >60 mL/min (ref >60)
Glucose, Bld: 89 mg/dL (ref 70–99)
Potassium: 4.3 mmol/L (ref 3.5–5.1)
Sodium: 138 mmol/L (ref 135–145)

## 2021-08-11 LAB — TROPONIN I (HIGH SENSITIVITY): Troponin I (High Sensitivity): 4 ng/L (ref <18)

## 2021-08-11 NOTE — Telephone Encounter (Signed)
Pt c/o BP issue: STAT if pt c/o blurred vision, one-sided weakness or slurred speech  1. What are your last 5 BP readings? 144/107 and 144/87 both readings are from this afternoon. He has been laying down for about 45 minutes right now.   2. Are you having any other symptoms (ex. Dizziness, headache, blurred vision, passed out)? Lethargic - per wife, pt just keeps saying he doesn't feel good and has some blurred vision. Wife states he looks pale.  3. What is your BP issue? Elevated BP   STAT if HR is under 50 or over 120 (normal HR is 60-100 beats per minute)  What is your heart rate? 44  Do you have a log of your heart rate readings (document readings)? 44 and 48 this afternoon.   Do you have any other symptoms? See above   Patient came home from lunch and his wife took his BP because he was not feeling well. She states when he is laying down his color comes back but when he stands up that's when his BP shoots back up and he loses his color. Patient took his medication around 7:45am this morning.

## 2021-08-11 NOTE — Telephone Encounter (Signed)
Spoke with pt wife who reports pt has blurred vision, BP 144/107-44.  Reports pt is extremely weak and eyes look yellow.  Advised spouse to go to ED for evaluation.  She is agreeable and will go to [facility] location.

## 2021-08-11 NOTE — ED Triage Notes (Signed)
Pt with hx of htn has been feeling generally weak as well as dizzy and not himself.  Pt has had some blurred vision. Symptoms have been going on for a week.  Pt was seen by cardiologist and was told that his creatnine was elevated and his liver function and glucose was elevated- no action was taken.

## 2021-08-12 MED ORDER — AMLODIPINE BESYLATE 5 MG PO TABS: 10.0000 mg | ORAL_TABLET | Freq: Every day | ORAL | 0 refills | Status: DC

## 2021-08-12 NOTE — Discharge Instructions (Signed)
After discussion with the cardiologist they recommended stopping your metoprolol and increasing your dose of amlodipine to 10 mg a day.  Please call the cardiology office on Monday to try and schedule an appointment this week to be reseen.  Please return to the emergency department for worsening symptoms especially if you develop chest pain or unable to walk or pass out or feel like you are going to pass out.  Please eat and drink as well as you can for the next 48 hours.

## 2021-08-12 NOTE — ED Notes (Signed)
Pt verbalizes understanding of discharge instructions. Opportunity for questioning and answers were provided. Pt discharged from ED to home.   ? ?

## 2021-08-12 NOTE — ED Provider Notes (Signed)
[facility] EMERGENCY DEPT Provider Note   CSN: [id number] Arrival date & time: 08/11/21  2054     History  Chief Complaint  Patient presents with   Bradycardia    Kevin Navarro is a 46 y.o. male.  46 yo M with a chief complaints of not feeling well.  Is been going on for the past week.  He has just felt very tired.  He has been seen by his family doctor is also had a recent allergy shot injection.  Saw his cardiologist because he had run out of his home metoprolol and had gone for refill.  Got it represcribed and took his first dose yesterday.  He felt a little bit more fatigued than he had before when he checked his vital signs and and that his heart rate was slower than it was yesterday and called the cardiologist who suggested he come to the ED for evaluation.  He has trouble describing his symptoms.  Tells me that he feels very fatigued and that sometimes he feels like his vision is blurry.  His wife feels like his color has changed a bit usually when he is up and moving around.  She tells me has had episodes like this before when his blood pressure was elevated.       Home Medications Prior to Admission medications   Medication Sig Start Date End Date Taking? Authorizing Provider  amLODipine (NORVASC) 5 MG tablet Take 2 tablets (10 mg total) by mouth daily. 08/12/21 09/11/21  Melene Plan, DO  EPINEPHrine 0.3 mg/0.3 mL IJ SOAJ injection 0.3 Milligram(s) IM Once PRN 03/22/21   [provider]  metoprolol succinate (TOPROL-XL) 100 MG 24 hr tablet Take 1 tablet (100 mg total) by mouth daily. 08/09/21   Dyann Kief, PA-C  Multiple Vitamin (MULTIVITAMIN WITH MINERALS) TABS tablet Take 1 tablet by mouth daily.    [provider]      Allergies    Bee venom and Hydrocodone    Review of Systems   Review of Systems  Physical Exam Updated Vital Signs BP 128/87    Pulse (!) 50    Temp 98 F (36.7 C) (Oral)    Resp 13    Ht 5\' 6"  (1.676  m)    Wt 78.1 kg    SpO2 97%    BMI 27.79 kg/m  Physical Exam Vitals and nursing note reviewed.  Constitutional:      Appearance: He is well-developed.  HENT:     Head: Normocephalic and atraumatic.  Eyes:     Pupils: Pupils are equal, round, and reactive to light.  Neck:     Vascular: No JVD.  Cardiovascular:     Rate and Rhythm: Normal rate and regular rhythm.     Heart sounds: No murmur heard.   No friction rub. No gallop.  Pulmonary:     Effort: No respiratory distress.     Breath sounds: No wheezing.  Abdominal:     General: There is no distension.     Tenderness: There is no abdominal tenderness. There is no guarding or rebound.  Musculoskeletal:        General: Normal range of motion.     Cervical back: Normal range of motion and neck supple.  Skin:    Coloration: Skin is not pale.     Findings: No rash.  Neurological:     Mental Status: He is alert and oriented to person, place, and time.     Cranial Nerves:  Cranial nerves 2-12 are intact.     Sensory: Sensation is intact.     Motor: Motor function is intact.     Coordination: Coordination is intact.     Comments: Ambulates without issue.  Finger-to-nose without issue with either extremity but somewhat slower with the right upper compared to the left.  Psychiatric:        Behavior: Behavior normal.    ED Results / Procedures / Treatments   Labs (all labs ordered are listed, but only abnormal results are displayed) Labs Reviewed  CBC - Abnormal; Notable for the following components:      Result Value   WBC 10.6 (*)    All other components within normal limits  BASIC METABOLIC PANEL  URINALYSIS, ROUTINE W REFLEX MICROSCOPIC  CBG MONITORING, ED  TROPONIN I (HIGH SENSITIVITY)  TROPONIN I (HIGH SENSITIVITY)    EKG EKG Interpretation  Date/Time:  Friday August 11 2021 21:10:04 EST Ventricular Rate:  48 PR Interval:  190 QRS Duration: 106 QT Interval:  442 QTC Calculation: 394 R Axis:   103 Text  Interpretation: Sinus bradycardia Rightward axis Borderline ECG When compared with ECG of 22-Jan-2019 15:41, No significant change was found Confirmed by Meridee Score [id number]) on 08/11/2021 10:16:34 PM  Radiology No results found.  Procedures Procedures    Medications Ordered in ED Medications - No data to display  ED Course/ Medical Decision Making/ A&P                           Medical Decision Making Amount and/or Complexity of Data Reviewed Labs: ordered.  Risk Prescription drug management.   46 yo M with a chief complaint of not feeling well.  This been going on for about a week now.  He felt a little bit worse today and when he checked his vital signs noted that he was bradycardic and hypertensive.  Called his cardiologist who suggested he come to the ED for evaluation.  Heart rate is in the 40s fairly persistently.  No issue with hypotension.  He feels generally unwell but has felt so for the past week.  His blood work today is reassuring, no significant renal dysfunction no anemia his white count is down to 10,000 from 20,000 yesterday which is most consistent with his steroid injection with his allergy shot 2 days ago.  Family is asking what they can take for his blood pressure at home.  I suspect he likely needs to hold his metoprolol.  Will discuss with cardiology.  I discussed the case with the cardiology fellow, Dr. Hyacinth Meeker recommended holding metoprolol wound going up on his amlodipine from 5-10.  Recommended calling the office to try and set up an appointment this week.  I discussed the plan with the patient and family.  They are somewhat concerned because they do not feel that it is necessarily a problem with his medication and he has been on it for some time and has not had any issues with it though as I talked more with him in the room it sounds like he has had problems like this off and on for some time.  Initially told me it been going on for a week but it sounds  like it is at least been 2 weeks that he has been very fatigued at time usually when he is up and trying to move around so perhaps he has been having some issues with his beta-blockade.  12:48 AM:  I have discussed the diagnosis/risks/treatment options with the patient and family.  Evaluation and diagnostic testing in the emergency department does not suggest an emergent condition requiring admission or immediate intervention beyond what has been performed at this time.  They will follow up with  PCP, Cards. We also discussed returning to the ED immediately if new or worsening sx occur. We discussed the sx which are most concerning (e.g., sudden worsening pain, fever, inability to tolerate by mouth) that necessitate immediate return. Medications administered to the patient during their visit and any new prescriptions provided to the patient are listed below.  Medications given during this visit Medications - No data to display   The patient appears reasonably screen and/or stabilized for discharge and I doubt any other medical condition or other [facility] requiring further screening, evaluation, or treatment in the ED at this time prior to discharge.          Final Clinical Impression(s) / ED Diagnoses Final diagnoses:  Sinus bradycardia    Rx / DC Orders ED Discharge Orders          Ordered    amLODipine (NORVASC) 5 MG tablet  Daily        08/12/21 0031              Melene Plan, DO 08/12/21 [phone]

## 2021-08-14 NOTE — Telephone Encounter (Signed)
Attempted to return call to patient and spouse Herbert Seta, called both [phone] and [phone]. No answer and voicemail full at both numbers, unable to leave message.

## 2021-08-14 NOTE — Telephone Encounter (Signed)
Patient's wife calling back. She says the patient went to the ED and they did nothing. She says they just told him to stop his metoprolol and call the office. She says today his BP is 160/110. Phone: [phone]

## 2021-08-14 NOTE — Telephone Encounter (Signed)
Patient returned call to discuss med changes from ED visit and BP reading.  Patient states he took Amlodipine 5mg  this morning, and BP remained elevated at 160/110. Patient states he then took another 5mg  of Amlodipine and BP is now 150/84 with pulse of 64. Patient denies any CP, dyspnea, HA, or visual/hearing changes. He did state when his BP was elevated earlier today he could feel something wasn't right in his chest but was not able to describe what that meant.  Per ED note: patient to take Amlodipine 10mg  daily (2 of the 5mg  tablets). Patient has appt with Dr. on 08/16/21.  Patient states he was not sure how to take the Amlodipine. After our discussion about the change made in ED to take 2 of the 5mg  tablets for a total of 10mg  of Amlodipine daily at the same time patient verbalized understanding.  Advised patient to call our office if his BP remains elevated. Patient verbalized understanding.  Will forward to Dr. and , RN for review.

## 2021-08-16 ENCOUNTER — Ambulatory Visit: Payer: 59 | Admitting: Cardiovascular Disease

## 2021-08-16 ENCOUNTER — Encounter: Payer: Self-pay | Admitting: Cardiovascular Disease

## 2021-08-16 ENCOUNTER — Other Ambulatory Visit: Payer: Self-pay

## 2021-08-16 VITALS — BP 122/70 | HR 66 | Ht 66.0 in | Wt 176.8 lb

## 2021-08-16 DIAGNOSIS — I10 Essential (primary) hypertension: Secondary | ICD-10-CM | POA: Diagnosis not present

## 2021-08-16 MED ORDER — METOPROLOL SUCCINATE ER 25 MG PO TB24: 25.0000 mg | ORAL_TABLET | Freq: Every day | ORAL | 3 refills | Status: DC

## 2021-08-16 NOTE — Patient Instructions (Signed)
Medication Instructions:  ?Your physician has recommended you make the following change in your medication:  ?1.) start metoprolol succinate (Toprol XL) 25 mg - take one tablet daily ? ?*If you need a refill on your cardiac medications before your next appointment, please call your pharmacy* ? ? ?Lab Work: ?none ? ?Testing/Procedures: ?Your physician has requested that you have an echocardiogram. Echocardiography is a painless test that uses sound waves to create images of your heart. It provides your doctor with information about the size and shape of your heart and how well your heart?s chambers and valves are working. This procedure takes approximately one hour. There are no restrictions for this procedure. ? ? ?Follow-Up: ?At [facility], you and your health needs are our priority.  As part of our continuing mission to provide you with exceptional heart care, we have created designated Provider Care Teams.  These Care Teams include your primary Cardiologist (physician) and Advanced Practice Providers (APPs -  Physician Assistants and Nurse Practitioners) who all work together to provide you with the care you need, when you need it. ? ? ?Your next appointment:   ?3 month(s) ? ?The format for your next appointment:   ?In Person ? ?Provider:   ?Jacolyn Reedy, PA-C ? ?  ?

## 2021-08-16 NOTE — Progress Notes (Signed)
? ?Chief Complaint  ?Patient presents with  ? Follow-up  ?  HTN ?  ? ?History of Present Illness: 46 yo male with history of HTN and former tobacco abuse here today for cardiac follow up. He was admitted to [facility] in April 2018 with hypertensive urgency and had ST changes. Cardiac cath with no evidence of obstructive CAD. Myocardial bridging noted in the LAD. Echo with LVEF=55-60%. He has been seen several times over the past few years by Jacolyn Reedy, PA-C for management of his blood pressure. He was seen on 08/09/21 and was doing well. He was then seen in the ED 08/12/21 feeling fatigued. This was following an allergy shot which he thinks had a steroid in it. BP was normal. Heart rate was 50 bpm. His Toprol was held and his Norvasc was increased.  ? ?He is here today for follow up. His wife is with him and is very concerned. His BP at home has been within a normal range. HR has been ok. He is feeling overall normal. Occasional odd feeling in his chest. The patient denies any dyspnea, palpitations, lower extremity edema, orthopnea, PND, dizziness, near syncope or syncope.  ? ?Primary Care Physician: [facility] ? ?Past Medical History:  ?Diagnosis Date  ? Chest pain of uncertain etiology 10/08/2016  ? Chest pain with moderate risk for cardiac etiology 10/09/2016  ? H/O cardiac catheterization   ? a. done 10/08/16 showing no evidence of CAD, myocardial bridging noted in the mid to distal LAD, normal LV function EF >65%,   ? HTN (hypertension)   ? Hyperglycemia   ? Hypokalemia   ? Meniscus, medial, bucket handle tear, old 06/22/2016  ? Myocardial bridge   ? Precordial pain   ? Status post arthroscopic surgery of left knee 07/07/2016  ? ? ?Past Surgical History:  ?Procedure Laterality Date  ? KNEE ARTHROSCOPY WITH MEDIAL MENISECTOMY Left 06/22/2016  ? Procedure: LEFT KNEE ARTHROSCOPY WITH PARTIAL MEDIAL MENISCECTOMY;  Surgeon: Kathryne Hitch, MD;  Location: [facility] ORS;  Service: Orthopedics;  Laterality: Left;   ? LEFT HEART CATH AND CORONARY ANGIOGRAPHY N/A 10/08/2016  ? Procedure: Left Heart Cath and Coronary Angiography;  Surgeon: Kathleene Hazel, MD;  Location: [facility] INVASIVE CV LAB;  Service: Cardiovascular;  Laterality: N/A;  ? None    ? ? ?Current Outpatient Medications  ?Medication Sig Dispense Refill  ? albuterol (VENTOLIN HFA) 108 (90 Base) MCG/ACT inhaler Inhale into the lungs every 6 (six) hours as needed for wheezing or shortness of breath.    ? amLODipine (NORVASC) 5 MG tablet Take 2 tablets (10 mg total) by mouth daily. 60 tablet 0  ? EPINEPHrine 0.3 mg/0.3 mL IJ SOAJ injection 0.3 Milligram(s) IM Once PRN    ? levocetirizine (XYZAL) 5 MG tablet Take 5 mg by mouth every evening.    ? metoprolol succinate (TOPROL XL) 25 MG 24 hr tablet Take 1 tablet (25 mg total) by mouth daily. 90 tablet 3  ? montelukast (SINGULAIR) 10 MG tablet Take 10 mg by mouth at bedtime.    ? Multiple Vitamin (MULTIVITAMIN WITH MINERALS) TABS tablet Take 1 tablet by mouth daily.    ? ?No current facility-administered medications for this visit.  ? ? ?Allergies  ?Allergen Reactions  ? Bee Venom Anaphylaxis  ? Hydrocodone Nausea And Vomiting  ? ? ?Social History  ? ?Socioeconomic History  ? Marital status: Married  ?  Spouse name: Not on file  ? Number of children: Not on file  ?  Years of education: Not on file  ? Highest education level: Not on file  ?Occupational History  ? Occupation: Corporate investment banker  ?Tobacco Use  ? Smoking status: Former  ?  Packs/day: 1.00  ?  Years: 10.00  ?  Pack years: 10.00  ?  Types: Cigarettes  ?  Quit date: 06/19/2011  ?  Years since quitting: 10.1  ? Smokeless tobacco: Never  ?Vaping Use  ? Vaping Use: Never used  ?Substance and Sexual Activity  ? Alcohol use: Yes  ?  Alcohol/week: 5.0 standard drinks  ?  Types: 5 Cans of beer per week  ?  Comment: 1 beer a week  ? Drug use: No  ? Sexual activity: Yes  ?  Birth control/protection: Condom, Coitus interruptus  ?Other Topics Concern  ? Not on file   ?Social History Narrative  ? ** Merged History Encounter **  ?    ? ?Social Determinants of Health  ? ?Financial Resource Strain: Not on file  ?Food Insecurity: Not on file  ?Transportation Needs: Not on file  ?Physical Activity: Not on file  ?Stress: Not on file  ?Social Connections: Not on file  ?Intimate Partner Violence: Not on file  ? ? ?Family History  ?Problem Relation Age of Onset  ? Hypertension Mother   ? CVA Father 38  ? ? ?Review of Systems:  As stated in the HPI and otherwise negative.  ? ?BP 122/70   Pulse 66   Ht 5\' 6"  (1.676 m)   Wt 176 lb 12.8 oz (80.2 kg)   SpO2 99%   BMI 28.54 kg/m?  ? ?Physical Examination: ?General: Well developed, well nourished, NAD  ?HEENT: OP clear, mucus membranes moist  ?SKIN: warm, dry. No rashes. ?Neuro: No focal deficits  ?Musculoskeletal: Muscle strength 5/5 all ext  ?Psychiatric: Mood and affect normal  ?Neck: No JVD, no carotid bruits, no thyromegaly, no lymphadenopathy.  ?Lungs:Clear bilaterally, no wheezes, rhonci, crackles ?Cardiovascular: Regular rate and rhythm. No murmurs, gallops or rubs. ?Abdomen:Soft. Bowel sounds present. Non-tender.  ?Extremities: No lower extremity edema. Pulses are 2 + in the bilateral DP/PT. ? ?EKG:  EKG is not ordered today. ?The ekg ordered today demonstrates  ? ?Recent Labs: ?08/09/2021: ALT 56; TSH 0.449 ?08/11/2021: BUN 13; Creatinine, Ser 0.79; Hemoglobin 14.9; Platelets 270; Potassium 4.3; Sodium 138  ? ?Lipid Panel ?   ?Component Value Date/Time  ? CHOL 209 (H) 08/09/2021 1151  ? TRIG 116 08/09/2021 1151  ? HDL 53 08/09/2021 1151  ? CHOLHDL 3.9 08/09/2021 1151  ? CHOLHDL 4.2 10/09/2016 1101  ? VLDL 48 (H) 10/09/2016 1101  ? LDLCALC 135 (H) 08/09/2021 1151  ? ?  ?Wt Readings from Last 3 Encounters:  ?08/16/21 176 lb 12.8 oz (80.2 kg)  ?08/11/21 172 lb 2.9 oz (78.1 kg)  ?08/09/21 175 lb (79.4 kg)  ?  ? ? ?Assessment and Plan:  ? ?1. HTN: BP is well controlled today on Norvasc 10 mg daily. He has been off of Toprol since  08/11/21. HR is 66 today. Given his LAD bridging, I would like to resume Toprol but will start 25 mg per day. Echo to assess LVEF. He will follow BP at home and record twice daily BP readings and give [place] a report in several weeks.  ? ?3. Thyroid disease: TSH low last week suggesting hyperthyroid state. He will need full thyroid studies. He will contact primary care.  ? ?Labs/ tests ordered today include:  ? ?Orders Placed This Encounter  ?Procedures  ?  ECHOCARDIOGRAM COMPLETE  ? ? ?Disposition:   F/U with me or Jacolyn Reedy, PA-C in 3 months.  ? ?Signed, ?Verne Carrow, MD ?08/16/2021 9:45 AM    ?[facility] ?[address] [place], [place], [place]  [address] ?Phone: [phone]; Fax: [phone]  ? ? ?

## 2021-08-24 ENCOUNTER — Ambulatory Visit (HOSPITAL_COMMUNITY): Payer: 59 | Attending: Cardiovascular Disease

## 2021-08-24 ENCOUNTER — Other Ambulatory Visit: Payer: Self-pay

## 2021-08-24 DIAGNOSIS — I10 Essential (primary) hypertension: Secondary | ICD-10-CM | POA: Insufficient documentation

## 2021-08-24 LAB — COMPREHENSIVE METABOLIC PANEL
ALT: 56 IU/L — ABNORMAL HIGH (ref 0–44)
AST: 27 IU/L (ref 0–40)
Albumin/Globulin Ratio: 1.6 (ref 1.2–2.2)
Albumin: 5 g/dL (ref 4.0–5.0)
Alkaline Phosphatase: 81 IU/L (ref 44–121)
BUN/Creatinine Ratio: 26 — ABNORMAL HIGH (ref 9–20)
BUN: 24 mg/dL (ref 6–24)
Bilirubin Total: 0.3 mg/dL (ref 0.0–1.2)
CO2: 23 mmol/L (ref 20–29)
Calcium: 9.9 mg/dL (ref 8.7–10.2)
Chloride: 106 mmol/L (ref 96–106)
Creatinine, Ser: 0.94 mg/dL (ref 0.76–1.27)
Globulin, Total: 3.1 g/dL (ref 1.5–4.5)
Glucose: 115 mg/dL — ABNORMAL HIGH (ref 70–99)
Potassium: 4.4 mmol/L (ref 3.5–5.2)
Sodium: 143 mmol/L (ref 134–144)
Total Protein: 8.1 g/dL (ref 6.0–8.5)
eGFR: 103 mL/min/{1.73_m2} (ref >59)

## 2021-08-24 LAB — LIPID PANEL
Chol/HDL Ratio: 3.9 ratio (ref 0.0–5.0)
Cholesterol, Total: 209 mg/dL — ABNORMAL HIGH (ref 100–199)
HDL: 53 mg/dL (ref >39)
LDL Chol Calc (NIH): 135 mg/dL — ABNORMAL HIGH (ref 0–99)
Triglycerides: 116 mg/dL (ref 0–149)
VLDL Cholesterol Cal: 21 mg/dL (ref 5–40)

## 2021-08-24 LAB — CBC
Hematocrit: 44.5 % (ref 37.5–51.0)
Hemoglobin: 14.8 g/dL (ref 13.0–17.7)
MCH: 29.7 pg (ref 26.6–33.0)
MCHC: 33.3 g/dL (ref 31.5–35.7)
MCV: 89 fL (ref 79–97)
Platelets: 290 10*3/uL (ref 150–450)
RBC: 4.99 x10E6/uL (ref 4.14–5.80)
RDW: 12.5 % (ref 11.6–15.4)
WBC: 20.1 10*3/uL (ref 3.4–10.8)

## 2021-08-24 LAB — TSH: TSH: 0.449 u[IU]/mL — ABNORMAL LOW (ref 0.450–4.500)

## 2021-08-24 LAB — ECHOCARDIOGRAM COMPLETE
Area-P 1/2: 4.1 cm2
S' Lateral: 3 cm

## 2021-08-24 LAB — LIPOPROTEIN A (LPA): Lipoprotein (a): 80.9 nmol/L — ABNORMAL HIGH (ref <75.0)

## 2021-09-29 ENCOUNTER — Other Ambulatory Visit: Payer: Self-pay

## 2021-09-29 MED ORDER — AMLODIPINE BESYLATE 5 MG PO TABS: 10.0000 mg | ORAL_TABLET | Freq: Every day | ORAL | 3 refills | Status: DC

## 2021-09-29 NOTE — Telephone Encounter (Signed)
Pt's medication was sent to pt's pharmacy as requested. Confirmation received.  °

## 2021-11-15 NOTE — Progress Notes (Signed)
Cardiology Office Note    Date:  11/28/2021   ID:  Kevin Navarro, DOB [birth year], MRN [id number]   PCP:  [facility]  Cardiologist:  Verne Carrow, MD   Advanced Practice Provider:  No care team member to display Electrophysiologist:  None   [id number]}   Chief Complaint  Patient presents with   Follow-up    History of Present Illness:  Kevin Navarro is a 46 y.o. male with history of hypertension, abnormal EKG with ST elevation in lead V2 in the setting of hypertensive urgency prompting cardiac catheterization 10/08/2016 show no evidence of CAD with myocardial bridging noted in the mid to distal LAD normal LVEF greater than 65%, no evidence of ascending aortic dissection. 2D echo showed mild basal hypertrophy of the septum LVEF 55 to 60% trace of MR and TR.   He was in ED 07/2021 and metoprolol stopped for fatigue and HR in 50's, amlodipine increased 10 mg.  Patient saw Dr. Clifton James 08/16/21  and he wanted him back on metoprolol with myocardial bridging, TSH also low and recommended f/u with PCP. Echo 08/24/21 normal LVEF normal echo.  Patient comes in for f/u. Walks/run 4 miles daily and going to the gym. Trying to lose weight. BP much better. No chest pain, dyspnea, dizziness or presyncope.Gets tired after allergy shots and montelukast-was told to take it in the evening  Past Medical History:  Diagnosis Date   Chest pain of uncertain etiology 10/08/2016   Chest pain with moderate risk for cardiac etiology 10/09/2016   H/O cardiac catheterization    a. done 10/08/16 showing no evidence of CAD, myocardial bridging noted in the mid to distal LAD, normal LV function EF >65%,    HTN (hypertension)    Hyperglycemia    Hypokalemia    Meniscus, medial, bucket handle tear, old 06/22/2016   Myocardial bridge    Precordial pain    Status post arthroscopic surgery of left knee 07/07/2016    Past Surgical History:   Procedure Laterality Date   KNEE ARTHROSCOPY WITH MEDIAL MENISECTOMY Left 06/22/2016   Procedure: LEFT KNEE ARTHROSCOPY WITH PARTIAL MEDIAL MENISCECTOMY;  Surgeon: Kathryne Hitch, MD;  Location: [facility];  Service: Orthopedics;  Laterality: Left;   LEFT HEART CATH AND CORONARY ANGIOGRAPHY N/A 10/08/2016   Procedure: Left Heart Cath and Coronary Angiography;  Surgeon: Kathleene Hazel, MD;  Location: [facility] INVASIVE CV LAB;  Service: Cardiovascular;  Laterality: N/A;   None      Current Medications: Current Meds  Medication Sig   albuterol (VENTOLIN HFA) 108 (90 Base) MCG/ACT inhaler Inhale into the lungs every 6 (six) hours as needed for wheezing or shortness of breath.   amLODipine (NORVASC) 5 MG tablet Take 2 tablets (10 mg total) by mouth daily.   EPINEPHrine 0.3 mg/0.3 mL IJ SOAJ injection 0.3 Milligram(s) IM Once PRN   levocetirizine (XYZAL) 5 MG tablet Take 5 mg by mouth every evening.   metoprolol succinate (TOPROL XL) 25 MG 24 hr tablet Take 1 tablet (25 mg total) by mouth daily.   montelukast (SINGULAIR) 10 MG tablet Take 10 mg by mouth at bedtime.   Multiple Vitamin (MULTIVITAMIN WITH MINERALS) TABS tablet Take 1 tablet by mouth daily.     Allergies:   Bee venom and Hydrocodone   Social History   Socioeconomic History   Marital status: Married    Spouse name: Not on file   Number of children: Not on file  Years of education: Not on file   Highest education level: Not on file  Occupational History   Occupation: Corporate investment bankerConstruction worker  Tobacco Use   Smoking status: Former    Packs/day: 1.00    Years: 10.00    Total pack years: 10.00    Types: Cigarettes    Quit date: 06/19/2011    Years since quitting: 10.4   Smokeless tobacco: Never  Vaping Use   Vaping Use: Never used  Substance and Sexual Activity   Alcohol use: Yes    Alcohol/week: 5.0 standard drinks of alcohol    Types: 5 Cans of beer per week    Comment: 1 beer a week   Drug use: No   Sexual  activity: Yes    Birth control/protection: Condom, Coitus interruptus  Other Topics Concern   Not on file  Social History Narrative   ** Merged History Encounter **       Social Determinants of Health   Financial Resource Strain: Not on file  Food Insecurity: Not on file  Transportation Needs: Not on file  Physical Activity: Not on file  Stress: Not on file  Social Connections: Not on file     Family History:  The patient's  family history includes CVA (age of onset: 5660) in his father; Hypertension in his mother.   ROS:   Please see the history of present illness.    ROS All other systems reviewed and are negative.   PHYSICAL EXAM:   VS:  BP 112/64   Pulse 68   Ht 5\' 6"  (1.676 m)   Wt 179 lb 9.6 oz (81.5 kg)   SpO2 96%   BMI 28.99 kg/m   Physical Exam  GEN: Well nourished, well developed, in no acute distress  Neck: no JVD, carotid bruits, or masses Cardiac:RRR; no murmurs, rubs, or gallops  Respiratory:  clear to auscultation bilaterally, normal work of breathing GI: soft, nontender, nondistended, + BS Ext: without cyanosis, clubbing, or edema, Good distal pulses bilaterally Neuro:  Alert and Oriented x 3, Psych: euthymic mood, full affect  Wt Readings from Last 3 Encounters:  11/28/21 179 lb 9.6 oz (81.5 kg)  08/16/21 176 lb 12.8 oz (80.2 kg)  08/11/21 172 lb 2.9 oz (78.1 kg)      Studies/Labs Reviewed:   EKG:  EKG is not ordered today.    Recent Labs: 08/09/2021: ALT 56; TSH 0.449 08/11/2021: BUN 13; Creatinine, Ser 0.79; Hemoglobin 14.9; Platelets 270; Potassium 4.3; Sodium 138   Lipid Panel    Component Value Date/Time   CHOL 209 (H) 08/09/2021 1151   TRIG 116 08/09/2021 1151   HDL 53 08/09/2021 1151   CHOLHDL 3.9 08/09/2021 1151   CHOLHDL 4.2 10/09/2016 1101   VLDL 48 (H) 10/09/2016 1101   LDLCALC 135 (H) 08/09/2021 1151    Additional studies/ records that were reviewed today include:  Echo 08/24/21 IMPRESSIONS     1. Left ventricular  ejection fraction, by estimation, is 60 to 65%. Left  ventricular ejection fraction by 3D volume is 63 %. The left ventricle has  normal function. The left ventricle has no regional wall motion  abnormalities. Left ventricular diastolic   parameters were normal. The average left ventricular global longitudinal  strain is -26.5 %. The global longitudinal strain is normal.   2. Right ventricular systolic function is normal. The right ventricular  size is normal. Tricuspid regurgitation signal is inadequate for assessing  PA pressure.   3. The mitral valve is normal  in structure. Trivial mitral valve  regurgitation. No evidence of mitral stenosis.   4. The aortic valve is normal in structure. Aortic valve regurgitation is  not visualized. No aortic stenosis is present.   5. The inferior vena cava is normal in size with greater than 50%  respiratory variability, suggesting right atrial pressure of 3 mmHg.   FINDINGS   Left Ventricle: Left ventricular ejection fraction, by estimation, is 60  to 65%. Left ventricular ejection fraction by 3D volume is 63 %. The left  ventricle has normal function. The left ventricle has no regional wall  motion abnormalities. The average  left ventricular global longitudinal strain is -26.5 %. The global  longitudinal strain is normal. The left ventricular internal cavity size  was normal in size. There is no left ventricular hypertrophy. Left  ventricular diastolic parameters were normal.  Normal left ventricular filling pressure.   Right Ventricle: The right ventricular size is normal. No increase in  right ventricular wall thickness. Right ventricular systolic function is  normal. Tricuspid regurgitation signal is inadequate for assessing PA  pressure.   Left Atrium: Left atrial size was normal in size.   Right Atrium: Right atrial size was normal in size.   Pericardium: There is no evidence of pericardial effusion.   Mitral Valve: The mitral valve  is normal in structure. Trivial mitral  valve regurgitation. No evidence of mitral valve stenosis.   Tricuspid Valve: The tricuspid valve is normal in structure. Tricuspid  valve regurgitation is trivial. No evidence of tricuspid stenosis.   Aortic Valve: The aortic valve is normal in structure. Aortic valve  regurgitation is not visualized. No aortic stenosis is present.   Pulmonic Valve: The pulmonic valve was normal in structure. Pulmonic valve  regurgitation is not visualized. No evidence of pulmonic stenosis.   Aorta: The aortic root is normal in size and structure.   Venous: The inferior vena cava is normal in size with greater than 50%  respiratory variability, suggesting right atrial pressure of 3 mmHg.   IAS/Shunts: No atrial level shunt detected by color flow Doppler.   Echo 09/2016 study Conclusions   - Left ventricle: The cavity size was normal. There was mild focal   basal hypertrophy of the septum. Systolic function was normal.   The estimated ejection fraction was in the range of 55% to 60%.   Wall motion was normal; there were no regional wall motion   abnormalities. Left ventricular diastolic function parameters   were normal.   Impressions:   - Normal LV systolic and diastolic function; trace MR and TR.   Cardiac cath 4/23/2018The left ventricular systolic function is normal. LV end diastolic pressure is normal. The left ventricular ejection fraction is greater than 65% by visual estimate. There is no mitral valve regurgitation.   1. No angiographic evidence of CAD 2. Myocardial bridging noted in the mid to distal LAD 3. Normal LV systolic function 4. No evidence of ascending aortic dissection   Recommendations: Will monitor on telemetry tonight. Echo in am to exclude pericardial effusion and structural heart disease.                  Risk Assessment/Calculations:         ASSESSMENT:    1. Essential hypertension   2. Myocardial bridge    3. Hyperlipidemia, unspecified hyperlipidemia type      PLAN:  In order of problems listed above:  Essential hypertension echo with normal LVEF 08/24/21   Myocardial bridge  in the mid to distal LAD with normal LVEF and no evidence of aortic dissection at cardiac cath 2018, 2D echo normal LVEF 55 to 60% with a trace of MR and TR mild basal hypertrophy of the septum.  No chest pain. Dr. Clifton James recommended restarting metoprolol   Hyperlipidemia LDL  135 08/09/21-trying to get down with diet and exercise.     Shared Decision Making/Informed Consent        Medication Adjustments/Labs and Tests Ordered: Current medicines are reviewed at length with the patient today.  Concerns regarding medicines are outlined above.  Medication changes, Labs and Tests ordered today are listed in the Patient Instructions below. There are no Patient Instructions on file for this visit.   Elson Clan, PA-C  11/28/2021 9:29 AM    [facility] [address] [place], [place], [place]  [address] Phone: [phone]; Fax: [phone]

## 2021-11-28 ENCOUNTER — Encounter: Payer: Self-pay | Admitting: Physician Assistant

## 2021-11-28 ENCOUNTER — Ambulatory Visit (INDEPENDENT_AMBULATORY_CARE_PROVIDER_SITE_OTHER): Payer: 59 | Admitting: Physician Assistant

## 2021-11-28 VITALS — BP 112/64 | HR 68 | Ht 66.0 in | Wt 179.6 lb

## 2021-11-28 DIAGNOSIS — E785 Hyperlipidemia, unspecified: Secondary | ICD-10-CM

## 2021-11-28 DIAGNOSIS — Q245 Malformation of coronary vessels: Secondary | ICD-10-CM

## 2021-11-28 DIAGNOSIS — I10 Essential (primary) hypertension: Secondary | ICD-10-CM

## 2021-11-28 NOTE — Patient Instructions (Signed)
Medication Instructions:  Your physician recommends that you continue on your current medications as directed. Please refer to the Current Medication list given to you today.  *If you need a refill on your cardiac medications before your next appointment, please call your pharmacy*   Lab Work: None If you have labs (blood work) drawn today and your tests are completely normal, you will receive your results only by: MyChart Message (if you have MyChart) OR A paper copy in the mail If you have any lab test that is abnormal or we need to change your treatment, we will call you to review the results.   Follow-Up: At [facility], you and your health needs are our priority.  As part of our continuing mission to provide you with exceptional heart care, we have created designated Provider Care Teams.  These Care Teams include your primary Cardiologist (physician) and Advanced Practice Providers (APPs -  Physician Assistants and Nurse Practitioners) who all work together to provide you with the care you need, when you need it.   Your next appointment:   6 month(s)  The format for your next appointment:   In Person  Provider:   Christopher McAlhany, MD {   

## 2022-05-20 NOTE — Progress Notes (Unsigned)
No chief complaint on file.  History of Present Illness: 46 yo male with history of HTN and former tobacco abuse here today for cardiac follow up. He was admitted to [facility] in April 2018 with hypertensive urgency and had ST changes. Cardiac cath with no evidence of obstructive CAD. Myocardial bridging noted in the LAD. Echo in 2018 with normal LV function. He has been followed in our office for management of his HTN.Echo March 2023 with LVEF=60-65%. No valve disease.   He is here today for follow up. The patient denies any chest pain, dyspnea, palpitations, lower extremity edema, orthopnea, PND, dizziness, near syncope or syncope.   Primary Care Physician: [facility]  Past Medical History:  Diagnosis Date   Chest pain of uncertain etiology 10/08/2016   Chest pain with moderate risk for cardiac etiology 10/09/2016   H/O cardiac catheterization    a. done 10/08/16 showing no evidence of CAD, myocardial bridging noted in the mid to distal LAD, normal LV function EF >65%,    HTN (hypertension)    Hyperglycemia    Hypokalemia    Meniscus, medial, bucket handle tear, old 06/22/2016   Myocardial bridge    Precordial pain    Status post arthroscopic surgery of left knee 07/07/2016    Past Surgical History:  Procedure Laterality Date   KNEE ARTHROSCOPY WITH MEDIAL MENISECTOMY Left 06/22/2016   Procedure: LEFT KNEE ARTHROSCOPY WITH PARTIAL MEDIAL MENISCECTOMY;  Surgeon: Kathryne Hitch, MD;  Location: [facility];  Service: Orthopedics;  Laterality: Left;   LEFT HEART CATH AND CORONARY ANGIOGRAPHY N/A 10/08/2016   Procedure: Left Heart Cath and Coronary Angiography;  Surgeon: Kathleene Hazel, MD;  Location: [facility] INVASIVE CV LAB;  Service: Cardiovascular;  Laterality: N/A;   None      Current Outpatient Medications  Medication Sig Dispense Refill   albuterol (VENTOLIN HFA) 108 (90 Base) MCG/ACT inhaler Inhale into the lungs every 6 (six) hours as needed for wheezing or shortness  of breath.     amLODipine (NORVASC) 5 MG tablet Take 2 tablets (10 mg total) by mouth daily. 180 tablet 3   EPINEPHrine 0.3 mg/0.3 mL IJ SOAJ injection 0.3 Milligram(s) IM Once PRN     levocetirizine (XYZAL) 5 MG tablet Take 5 mg by mouth every evening.     metoprolol succinate (TOPROL XL) 25 MG 24 hr tablet Take 1 tablet (25 mg total) by mouth daily. 90 tablet 3   montelukast (SINGULAIR) 10 MG tablet Take 10 mg by mouth at bedtime.     Multiple Vitamin (MULTIVITAMIN WITH MINERALS) TABS tablet Take 1 tablet by mouth daily.     No current facility-administered medications for this visit.    Allergies  Allergen Reactions   Bee Venom Anaphylaxis   Hydrocodone Nausea And Vomiting    Social History   Socioeconomic History   Marital status: Married    Spouse name: Not on file   Number of children: Not on file   Years of education: Not on file   Highest education level: Not on file  Occupational History   Occupation: Corporate investment banker  Tobacco Use   Smoking status: Former    Packs/day: 1.00    Years: 10.00    Total pack years: 10.00    Types: Cigarettes    Quit date: 06/19/2011    Years since quitting: 10.9   Smokeless tobacco: Never  Vaping Use   Vaping Use: Never used  Substance and Sexual Activity   Alcohol use: Yes  Alcohol/week: 5.0 standard drinks of alcohol    Types: 5 Cans of beer per week    Comment: 1 beer a week   Drug use: No   Sexual activity: Yes    Birth control/protection: Condom, Coitus interruptus  Other Topics Concern   Not on file  Social History Narrative   ** Merged History Encounter **       Social Determinants of Health   Financial Resource Strain: Not on file  Food Insecurity: Not on file  Transportation Needs: Not on file  Physical Activity: Not on file  Stress: Not on file  Social Connections: Not on file  Intimate Partner Violence: Not on file    Family History  Problem Relation Age of Onset   Hypertension Mother    CVA Father  33    Review of Systems:  As stated in the HPI and otherwise negative.   There were no vitals taken for this visit.  Physical Examination: General: Well developed, well nourished, NAD  HEENT: OP clear, mucus membranes moist  SKIN: warm, dry. No rashes. Neuro: No focal deficits  Musculoskeletal: Muscle strength 5/5 all ext  Psychiatric: Mood and affect normal  Neck: No JVD, no carotid bruits, no thyromegaly, no lymphadenopathy.  Lungs:Clear bilaterally, no wheezes, rhonci, crackles Cardiovascular: Regular rate and rhythm. No murmurs, gallops or rubs. Abdomen:Soft. Bowel sounds present. Non-tender.  Extremities: No lower extremity edema. Pulses are 2 + in the bilateral DP/PT.  EKG:  EKG *** ordered today. The ekg ordered today demonstrates   Echo March 2023:  1. Left ventricular ejection fraction, by estimation, is 60 to 65%. Left  ventricular ejection fraction by 3D volume is 63 %. The left ventricle has  normal function. The left ventricle has no regional wall motion  abnormalities. Left ventricular diastolic   parameters were normal. The average left ventricular global longitudinal  strain is -26.5 %. The global longitudinal strain is normal.   2. Right ventricular systolic function is normal. The right ventricular  size is normal. Tricuspid regurgitation signal is inadequate for assessing  PA pressure.   3. The mitral valve is normal in structure. Trivial mitral valve  regurgitation. No evidence of mitral stenosis.   4. The aortic valve is normal in structure. Aortic valve regurgitation is  not visualized. No aortic stenosis is present.   5. The inferior vena cava is normal in size with greater than 50%  respiratory variability, suggesting right atrial pressure of 3 mmHg.   Recent Labs: 08/09/2021: ALT 56; TSH 0.449 08/11/2021: BUN 13; Creatinine, Ser 0.79; Hemoglobin 14.9; Platelets 270; Potassium 4.3; Sodium 138   Lipid Panel    Component Value Date/Time   CHOL 209  (H) 08/09/2021 1151   TRIG 116 08/09/2021 1151   HDL 53 08/09/2021 1151   CHOLHDL 3.9 08/09/2021 1151   CHOLHDL 4.2 10/09/2016 1101   VLDL 48 (H) 10/09/2016 1101   LDLCALC 135 (H) 08/09/2021 1151     Wt Readings from Last 3 Encounters:  11/28/21 179 lb 9.6 oz (81.5 kg)  08/16/21 176 lb 12.8 oz (80.2 kg)  08/11/21 172 lb 2.9 oz (78.1 kg)    Assessment and Plan:   1. HTN: BP is well controlled. No changes  Labs/ tests ordered today include:  No orders of the defined types were placed in this encounter.   Disposition:   F/U with me in ***  Signed, Verne Carrow, MD 05/20/2022 11:16 AM    [facility] [address]  [address] [place], [place]  [address] Phone: [phone]; Fax: [phone]

## 2022-05-21 ENCOUNTER — Encounter: Payer: Self-pay | Admitting: Cardiovascular Disease

## 2022-05-21 ENCOUNTER — Ambulatory Visit: Payer: 59 | Attending: Cardiovascular Disease | Admitting: Cardiovascular Disease

## 2022-05-21 VITALS — BP 110/76 | HR 52 | Ht 66.0 in | Wt 167.4 lb

## 2022-05-21 DIAGNOSIS — I10 Essential (primary) hypertension: Secondary | ICD-10-CM | POA: Diagnosis not present

## 2022-05-21 MED ORDER — METOPROLOL SUCCINATE ER 25 MG PO TB24: 25.0000 mg | ORAL_TABLET | Freq: Every day | ORAL | 11 refills | Status: DC

## 2022-05-21 MED ORDER — AMLODIPINE BESYLATE 5 MG PO TABS
10.0000 mg | ORAL_TABLET | Freq: Every day | ORAL | 11 refills | Status: DC
Start: 2022-05-21 — End: 2023-05-13

## 2022-05-21 NOTE — Patient Instructions (Signed)
Medication Instructions:  No changes *If you need a refill on your cardiac medications before your next appointment, please call your pharmacy*   Lab Work: none If you have labs (blood work) drawn today and your tests are completely normal, you will receive your results only by: MyChart Message (if you have MyChart) OR A paper copy in the mail If you have any lab test that is abnormal or we need to change your treatment, we will call you to review the results.   Testing/Procedures: none   Follow-Up: At [facility], you and your health needs are our priority.  As part of our continuing mission to provide you with exceptional heart care, we have created designated Provider Care Teams.  These Care Teams include your primary Cardiologist (physician) and Advanced Practice Providers (APPs -  Physician Assistants and Nurse Practitioners) who all work together to provide you with the care you need, when you need it.   Your next appointment:   12 month(s)  The format for your next appointment:   In Person  Provider:   Christopher McAlhany, MD     Important Information About Sugar       

## 2022-11-07 ENCOUNTER — Emergency Department (HOSPITAL_COMMUNITY): Payer: 59

## 2022-11-07 ENCOUNTER — Emergency Department (HOSPITAL_COMMUNITY)
Admission: EM | Admit: 2022-11-07 | Discharge: 2022-11-07 | Disposition: A | Discharge status: Home / Self Care | Payer: 59 | Attending: Emergency Medicine | Admitting: Emergency Medicine

## 2022-11-07 ENCOUNTER — Other Ambulatory Visit: Payer: Self-pay

## 2022-11-07 ENCOUNTER — Encounter (HOSPITAL_COMMUNITY): Payer: Self-pay

## 2022-11-07 DIAGNOSIS — Z1152 Encounter for screening for COVID-19: Secondary | ICD-10-CM | POA: Diagnosis not present

## 2022-11-07 DIAGNOSIS — R072 Precordial pain: Secondary | ICD-10-CM | POA: Insufficient documentation

## 2022-11-07 LAB — TROPONIN I (HIGH SENSITIVITY)
Troponin I (High Sensitivity): <2 ng/L (ref <18)
Troponin I (High Sensitivity): 3 ng/L (ref <18)

## 2022-11-07 LAB — CBC WITH DIFFERENTIAL/PLATELET
Abs Immature Granulocytes: 0.07 10*3/uL (ref 0.00–0.07)
Basophils Absolute: 0 10*3/uL (ref 0.0–0.1)
Basophils Relative: 0 %
Eosinophils Absolute: 0.3 10*3/uL (ref 0.0–0.5)
Eosinophils Relative: 3 %
HCT: 40.2 % (ref 39.0–52.0)
Hemoglobin: 13.6 g/dL (ref 13.0–17.0)
Immature Granulocytes: 1 %
Lymphocytes Relative: 25 %
Lymphs Abs: 2.7 10*3/uL (ref 0.7–4.0)
MCH: 29.8 pg (ref 26.0–34.0)
MCHC: 33.8 g/dL (ref 30.0–36.0)
MCV: 88 fL (ref 80.0–100.0)
Monocytes Absolute: 0.7 10*3/uL (ref 0.1–1.0)
Monocytes Relative: 6 %
Neutro Abs: 6.8 10*3/uL (ref 1.7–7.7)
Neutrophils Relative %: 65 %
Platelets: 251 10*3/uL (ref 150–400)
RBC: 4.57 MIL/uL (ref 4.22–5.81)
RDW: 12 % (ref 11.5–15.5)
WBC: 10.6 10*3/uL — ABNORMAL HIGH (ref 4.0–10.5)
nRBC: 0 % (ref 0.0–0.2)

## 2022-11-07 LAB — BASIC METABOLIC PANEL
Anion gap: 10 (ref 5–15)
BUN: 11 mg/dL (ref 6–20)
CO2: 22 mmol/L (ref 22–32)
Calcium: 8.4 mg/dL — ABNORMAL LOW (ref 8.9–10.3)
Chloride: 101 mmol/L (ref 98–111)
Creatinine, Ser: 0.86 mg/dL (ref 0.61–1.24)
GFR, Estimated: >60 mL/min (ref >60)
Glucose, Bld: 137 mg/dL — ABNORMAL HIGH (ref 70–99)
Potassium: 3.5 mmol/L (ref 3.5–5.1)
Sodium: 133 mmol/L — ABNORMAL LOW (ref 135–145)

## 2022-11-07 LAB — RESP PANEL BY RT-PCR (RSV, FLU A&B, COVID)  RVPGX2
Influenza A by PCR: NEGATIVE
Influenza B by PCR: NEGATIVE
Resp Syncytial Virus by PCR: NEGATIVE
SARS Coronavirus 2 by RT PCR: NEGATIVE

## 2022-11-07 LAB — BRAIN NATRIURETIC PEPTIDE: B Natriuretic Peptide: 13.6 pg/mL (ref 0.0–100.0)

## 2022-11-07 MED ORDER — SUCRALFATE 1 G PO TABS
1.0000 g | ORAL_TABLET | Freq: Once | ORAL | Status: AC
  Administered 2022-11-07: 1 g via ORAL
  Filled 2022-11-07: qty 1

## 2022-11-07 MED ORDER — LIDOCAINE 5 % EX PTCH
1.0000 | MEDICATED_PATCH | CUTANEOUS | Status: DC
  Administered 2022-11-07: 1 via TRANSDERMAL
  Filled 2022-11-07: qty 1

## 2022-11-07 MED ORDER — OMEPRAZOLE 20 MG PO CPDR: 20.0000 mg | DELAYED_RELEASE_CAPSULE | Freq: Every day | ORAL | 0 refills | Status: AC

## 2022-11-07 MED ORDER — ALUM & MAG HYDROXIDE-SIMETH 200-200-20 MG/5ML PO SUSP
30.0000 mL | Freq: Once | ORAL | Status: AC
  Administered 2022-11-07: 30 mL via ORAL
  Filled 2022-11-07: qty 30

## 2022-11-07 MED ORDER — KETOROLAC TROMETHAMINE 30 MG/ML IJ SOLN
30.0000 mg | Freq: Once | INTRAMUSCULAR | Status: AC
  Administered 2022-11-07: 30 mg via INTRAVENOUS
  Filled 2022-11-07: qty 1

## 2022-11-07 NOTE — ED Triage Notes (Signed)
Pt BIB EMS, sudden onset of chest pain about 40 minutes ago. Cardiac hx. SOB, 02 89% on RA. On 3L [place] per EMS @ 99%.Elevation on 12-lead per EMS. Pt A&O x4, stable on arrival. No relief with nitro

## 2022-11-07 NOTE — ED Provider Notes (Signed)
[place] [facility]MENT AT [facility] Provider Note   CSN: [id number] Arrival date & time: 11/07/22  0044     History  Chief Complaint  Patient presents with   Chest Pain    Kevin Navarro is a 47 y.o. male.  The history is provided by the patient.  Chest Pain Pain location:  Substernal area and epigastric Pain quality: dull   Pain radiates to:  Does not radiate Pain severity:  Moderate Onset quality:  Sudden Timing:  Constant Progression:  Unchanged Chronicity:  New Context: not breathing   Relieved by:  Nothing Worsened by:  Nothing Ineffective treatments:  None tried Associated symptoms: no fever, no palpitations, no shortness of breath, no syncope, no vomiting and no weakness   Risk factors: no aortic disease   Patient working out with crunches and lifting weights at the gym and then went home and had ground meat tacos and then      Home Medications Prior to Admission medications   Medication Sig Start Date End Date Taking? Authorizing Provider  omeprazole (PRILOSEC) 20 MG capsule Take 1 capsule (20 mg total) by mouth daily. 11/07/22  Yes Jaycelynn Knickerbocker, MD  albuterol (VENTOLIN HFA) 108 (90 Base) MCG/ACT inhaler Inhale into the lungs every 6 (six) hours as needed for wheezing or shortness of breath.    [provider]  amLODipine (NORVASC) 5 MG tablet Take 2 tablets (10 mg total) by mouth daily. 05/21/22   Kathleene Hazel, MD  EPINEPHrine 0.3 mg/0.3 mL IJ SOAJ injection 0.3 Milligram(s) IM Once PRN 03/22/21   [provider]  levocetirizine (XYZAL) 5 MG tablet Take 5 mg by mouth every evening.    [provider]  metoprolol succinate (TOPROL XL) 25 MG 24 hr tablet Take 1 tablet (25 mg total) by mouth daily. 05/21/22   Kathleene Hazel, MD  montelukast (SINGULAIR) 10 MG tablet Take 10 mg by mouth at bedtime.    [provider]  Multiple Vitamin (MULTIVITAMIN WITH MINERALS) TABS tablet Take 1  tablet by mouth daily.    [provider]      Allergies    Bee venom and Hydrocodone    Review of Systems   Review of Systems  Constitutional:  Negative for fever.  HENT:  Negative for facial swelling.   Eyes:  Negative for photophobia.  Respiratory:  Negative for shortness of breath.   Cardiovascular:  Positive for chest pain. Negative for palpitations, leg swelling and syncope.  Gastrointestinal:  Negative for vomiting.  Genitourinary:  Negative for difficulty urinating.  Neurological:  Negative for facial asymmetry and weakness.  All other systems reviewed and are negative.   Physical Exam Updated Vital Signs BP 129/84   Pulse 62   Temp 97.7 F (36.5 C) (Oral)   Resp 14   Ht 5\' 6"  (1.676 m)   Wt 78.9 kg   SpO2 99%   BMI 28.08 kg/m  Physical Exam Vitals and nursing note reviewed.  Constitutional:      General: He is not in acute distress.    Appearance: He is well-developed. He is not diaphoretic.  HENT:     Head: Normocephalic and atraumatic.     Nose: Nose normal.     Mouth/Throat:     Mouth: Mucous membranes are moist.     Pharynx: Oropharynx is clear.  Eyes:     Conjunctiva/sclera: Conjunctivae normal.     Pupils: Pupils are equal, round, and reactive to light.  Cardiovascular:  Rate and Rhythm: Normal rate and regular rhythm.     Pulses: Normal pulses.     Heart sounds: Normal heart sounds.  Pulmonary:     Effort: Pulmonary effort is normal.     Breath sounds: Normal breath sounds. No wheezing or rales.  Abdominal:     General: Bowel sounds are normal.     Palpations: Abdomen is soft.     Tenderness: There is no abdominal tenderness. There is no guarding or rebound.    Musculoskeletal:        General: Normal range of motion.     Cervical back: Normal range of motion and neck supple.  Skin:    General: Skin is warm and dry.  Neurological:     Mental Status: He is alert and oriented to person, place, and time.     ED Results /  Procedures / Treatments   Labs (all labs ordered are listed, but only abnormal results are displayed) Results for orders placed or performed during the hospital encounter of 11/07/22  Resp panel by RT-PCR (RSV, Flu A&B, Covid) Anterior Nasal Swab   Specimen: Anterior Nasal Swab  Result Value Ref Range   SARS Coronavirus 2 by RT PCR NEGATIVE NEGATIVE   Influenza A by PCR NEGATIVE NEGATIVE   Influenza B by PCR NEGATIVE NEGATIVE   Resp Syncytial Virus by PCR NEGATIVE NEGATIVE  CBC with Differential  Result Value Ref Range   WBC 10.6 (H) 4.0 - 10.5 K/uL   RBC 4.57 4.22 - 5.81 MIL/uL   Hemoglobin 13.6 13.0 - 17.0 g/dL   HCT 16.1 09.6 - 04.5 %   MCV 88.0 80.0 - 100.0 fL   MCH 29.8 26.0 - 34.0 pg   MCHC 33.8 30.0 - 36.0 g/dL   RDW 40.9 81.1 - 91.4 %   Platelets 251 150 - 400 K/uL   nRBC 0.0 0.0 - 0.2 %   Neutrophils Relative % 65 %   Neutro Abs 6.8 1.7 - 7.7 K/uL   Lymphocytes Relative 25 %   Lymphs Abs 2.7 0.7 - 4.0 K/uL   Monocytes Relative 6 %   Monocytes Absolute 0.7 0.1 - 1.0 K/uL   Eosinophils Relative 3 %   Eosinophils Absolute 0.3 0.0 - 0.5 K/uL   Basophils Relative 0 %   Basophils Absolute 0.0 0.0 - 0.1 K/uL   Immature Granulocytes 1 %   Abs Immature Granulocytes 0.07 0.00 - 0.07 K/uL  Basic metabolic panel  Result Value Ref Range   Sodium 133 (L) 135 - 145 mmol/L   Potassium 3.5 3.5 - 5.1 mmol/L   Chloride 101 98 - 111 mmol/L   CO2 22 22 - 32 mmol/L   Glucose, Bld 137 (H) 70 - 99 mg/dL   BUN 11 6 - 20 mg/dL   Creatinine, Ser 7.82 0.61 - 1.24 mg/dL   Calcium 8.4 (L) 8.9 - 10.3 mg/dL   GFR, Estimated >95 >62 mL/min   Anion gap 10 5 - 15  Brain natriuretic peptide  Result Value Ref Range   B Natriuretic Peptide 13.6 0.0 - 100.0 pg/mL  Troponin I (High Sensitivity)  Result Value Ref Range   Troponin I (High Sensitivity) <2 <18 ng/L  Troponin I (High Sensitivity)  Result Value Ref Range   Troponin I (High Sensitivity) 3 <18 ng/L   DG Chest Portable 1  View  Result Date: 11/07/2022 CLINICAL DATA:  Pain. EXAM: PORTABLE CHEST 1 VIEW COMPARISON:  01/22/2019. FINDINGS: The heart size and mediastinal contours are  within normal limits. Minimal atelectasis or scarring is present at the left lung base. No effusion or pneumothorax. No acute osseous abnormality. IMPRESSION: Minimal atelectasis or scarring at the left lung base. Electronically Signed   By: Thornell Sartorius M.D.   On: 11/07/2022 01:38    EKG EKG Interpretation  Date/Time:  Wednesday Nov 07 2022 00:59:18 EDT Ventricular Rate:  54 PR Interval:  226 QRS Duration: 115 QT Interval:  429 QTC Calculation: 407 R Axis:   105 Text Interpretation: Sinus rhythm Prolonged PR interval Nonspecific intraventricular conduction delay Confirmed by Mija Effertz ([id number]) on 11/07/2022 1:20:39 AM  Radiology DG Chest Portable 1 View  Result Date: 11/07/2022 CLINICAL DATA:  Pain. EXAM: PORTABLE CHEST 1 VIEW COMPARISON:  01/22/2019. FINDINGS: The heart size and mediastinal contours are within normal limits. Minimal atelectasis or scarring is present at the left lung base. No effusion or pneumothorax. No acute osseous abnormality. IMPRESSION: Minimal atelectasis or scarring at the left lung base. Electronically Signed   By: Thornell Sartorius M.D.   On: 11/07/2022 01:38    Procedures Procedures    Medications Ordered in ED Medications  lidocaine (LIDODERM) 5 % 1 patch (1 patch Transdermal Patch Applied 11/07/22 0500)  alum & mag hydroxide-simeth (MAALOX/MYLANTA) 200-200-20 MG/5ML suspension 30 mL (30 mLs Oral Given 11/07/22 0143)  ketorolac (TORADOL) 30 MG/ML injection 30 mg (30 mg Intravenous Given 11/07/22 0458)  sucralfate (CARAFATE) tablet 1 g (1 g Oral Given 11/07/22 0500)    ED Course/ Medical Decision Making/ A&P                             Medical Decision Making Patient with exercising and ate greasy taco   Amount and/or Complexity of Data Reviewed Independent Historian: spouse    Details: See  above  External Data Reviewed: notes.    Details: Previous notes reviewed  Labs: ordered.    Details: Troponins negative 2/3.  Negative covid and flu white count 10.6, normal hemoglobin 13.6, normal platelets. Sodium slight low 133, potassium normal 3.5 normal creatinine .27 Radiology: ordered and independent interpretation performed.    Details: Negative CXR ECG/medicine tests: ordered and independent interpretation performed. Decision-making details documented in ED Course.  Risk OTC drugs. Prescription drug management. Risk Details: Likely from doing crunches but may be acid reflux.  Will start medication for both and have patient follow up with PMD.  Have advised gerd friendly diet and not doing crunches this week.  PERC 0 wells 0 highly doubt PE in this low risk patient.  Stable for discharge.  Strict return.      Final Clinical Impression(s) / ED Diagnoses Final diagnoses:  Precordial pain   Return for intractable cough, coughing up blood, fevers > 100.4 unrelieved by medication, shortness of breath, intractable vomiting, chest pain, shortness of breath, weakness, numbness, changes in speech, facial asymmetry, abdominal pain, passing out, Inability to tolerate liquids or food, cough, altered mental status or any concerns. No signs of systemic illness or infection. The patient is nontoxic-appearing on exam and vital signs are within normal limits.  I have reviewed the triage vital signs and the nursing notes. Pertinent labs & imaging results that were available during my care of the patient were reviewed by me and considered in my medical decision making (see chart for details). After history, exam, and medical workup I feel the patient has been appropriately medically screened and is safe for discharge home. Pertinent diagnoses were discussed  with the patient. Patient was given return precautions.  Rx / DC Orders ED Discharge Orders          Ordered    omeprazole (PRILOSEC) 20 MG  capsule  Daily        11/07/22 0502              Morene Cecilio, MD 11/07/22 1610

## 2023-01-17 ENCOUNTER — Emergency Department (HOSPITAL_COMMUNITY)
Admission: EM | Admit: 2023-01-17 | Discharge: 2023-01-18 | Disposition: A | Discharge status: Home / Self Care | Payer: 59 | Source: Home / Self Care | Attending: Emergency Medicine | Admitting: Emergency Medicine

## 2023-01-17 ENCOUNTER — Other Ambulatory Visit: Payer: Self-pay

## 2023-01-17 DIAGNOSIS — I10 Essential (primary) hypertension: Secondary | ICD-10-CM | POA: Insufficient documentation

## 2023-01-17 DIAGNOSIS — T63441A Toxic effect of venom of bees, accidental (unintentional), initial encounter: Secondary | ICD-10-CM | POA: Insufficient documentation

## 2023-01-17 DIAGNOSIS — T7840XA Allergy, unspecified, initial encounter: Secondary | ICD-10-CM

## 2023-01-17 DIAGNOSIS — Z79899 Other long term (current) drug therapy: Secondary | ICD-10-CM | POA: Insufficient documentation

## 2023-01-17 MED ORDER — METHYLPREDNISOLONE SODIUM SUCC 125 MG IJ SOLR
125.0000 mg | INTRAMUSCULAR | Status: AC
  Administered 2023-01-17: 125 mg via INTRAVENOUS
  Filled 2023-01-17: qty 2

## 2023-01-17 MED ORDER — FAMOTIDINE IN NACL 20-0.9 MG/50ML-% IV SOLN
20.0000 mg | Freq: Once | INTRAVENOUS | Status: AC
  Administered 2023-01-17: 20 mg via INTRAVENOUS
  Filled 2023-01-17: qty 50

## 2023-01-17 MED ORDER — DIPHENHYDRAMINE HCL 50 MG/ML IJ SOLN
25.0000 mg | Freq: Once | INTRAMUSCULAR | Status: AC
Start: 2023-01-17 — End: 2023-01-17
  Administered 2023-01-17: 25 mg via INTRAVENOUS
  Filled 2023-01-17: qty 1

## 2023-01-17 NOTE — ED Triage Notes (Signed)
Patient presents with generalized itchy urticaria with tongue swelling/throat tightness after bee sting at left hand this evening .

## 2023-01-18 MED ORDER — EPINEPHRINE 0.3 MG/0.3ML IJ SOAJ: 0.3000 mg | INTRAMUSCULAR | 0 refills | Status: AC | PRN

## 2023-01-18 MED ORDER — PREDNISONE 10 MG PO TABS: 20.0000 mg | ORAL_TABLET | Freq: Every day | ORAL | 0 refills | Status: AC

## 2023-01-18 MED ORDER — FAMOTIDINE 20 MG PO TABS: 20.0000 mg | ORAL_TABLET | Freq: Two times a day (BID) | ORAL | 0 refills | Status: AC

## 2023-01-18 NOTE — ED Provider Notes (Signed)
[place] EMERGENCY DEPARTMENT AT [facility] Provider Note   CSN: [id number] Arrival date & time: 01/17/23  2244     History  Chief Complaint  Patient presents with   Allergic Reaction    Kevin Navarro is a 47 y.o. male.  The history is provided by the patient.  Allergic Reaction Presenting symptoms: itching and rash   Presenting symptoms: no difficulty breathing, no difficulty swallowing and no wheezing   Severity:  Moderate Context: insect bite/sting   Context comment:  Bee sting Relieved by:  Nothing Worsened by:  Nothing Ineffective treatments:  None tried     Past Medical History:  Diagnosis Date   Chest pain of uncertain etiology 10/08/2016   Chest pain with moderate risk for cardiac etiology 10/09/2016   H/O cardiac catheterization    a. done 10/08/16 showing no evidence of CAD, myocardial bridging noted in the mid to distal LAD, normal LV function EF >65%,    HTN (hypertension)    Hyperglycemia    Hypokalemia    Meniscus, medial, bucket handle tear, old 06/22/2016   Myocardial bridge    Precordial pain    Status post arthroscopic surgery of left knee 07/07/2016     Home Medications Prior to Admission medications   Medication Sig Start Date End Date Taking? Authorizing Provider  albuterol (VENTOLIN HFA) 108 (90 Base) MCG/ACT inhaler Inhale into the lungs every 6 (six) hours as needed for wheezing or shortness of breath.    [provider]  amLODipine (NORVASC) 5 MG tablet Take 2 tablets (10 mg total) by mouth daily. 05/21/22   Kathleene Hazel, MD  EPINEPHrine 0.3 mg/0.3 mL IJ SOAJ injection 0.3 Milligram(s) IM Once PRN 03/22/21   [provider]  levocetirizine (XYZAL) 5 MG tablet Take 5 mg by mouth every evening.    [provider]  metoprolol succinate (TOPROL XL) 25 MG 24 hr tablet Take 1 tablet (25 mg total) by mouth daily. 05/21/22   Kathleene Hazel, MD  montelukast (SINGULAIR) 10 MG tablet Take  10 mg by mouth at bedtime.    [provider]  Multiple Vitamin (MULTIVITAMIN WITH MINERALS) TABS tablet Take 1 tablet by mouth daily.    [provider]  omeprazole (PRILOSEC) 20 MG capsule Take 1 capsule (20 mg total) by mouth daily. 11/07/22   Stellah Donovan, MD      Allergies    Bee venom and Hydrocodone    Review of Systems   Review of Systems  Constitutional:  Negative for fever.  HENT:  Negative for facial swelling and trouble swallowing.   Respiratory:  Negative for wheezing and stridor.   Gastrointestinal:  Negative for nausea and vomiting.  Skin:  Positive for itching and rash.  All other systems reviewed and are negative.   Physical Exam Updated Vital Signs BP 111/75   Pulse 64   Temp 97.6 F (36.4 C) (Oral)   Resp (!) 23   SpO2 98%  Physical Exam Vitals and nursing note reviewed. Exam conducted with a chaperone present.  Constitutional:      General: He is not in acute distress.    Appearance: He is well-developed. He is not diaphoretic.  HENT:     Head: Normocephalic and atraumatic.     Nose: Nose normal.  Eyes:     Conjunctiva/sclera: Conjunctivae normal.     Pupils: Pupils are equal, round, and reactive to light.  Cardiovascular:     Rate and Rhythm: Normal rate and regular  rhythm.  Pulmonary:     Effort: Pulmonary effort is normal.     Breath sounds: Normal breath sounds. No wheezing or rales.  Abdominal:     General: Bowel sounds are normal.     Palpations: Abdomen is soft.     Tenderness: There is no abdominal tenderness. There is no guarding or rebound.  Musculoskeletal:        General: Normal range of motion.     Cervical back: Normal range of motion and neck supple.  Skin:    General: Skin is warm and dry.     Capillary Refill: Capillary refill takes less than 2 seconds.     Comments: Hives diffuse   Neurological:     General: No focal deficit present.     Mental Status: He is alert and oriented to person, place, and time.      Deep Tendon Reflexes: Reflexes normal.  Psychiatric:        Mood and Affect: Mood normal.        Behavior: Behavior normal.     ED Results / Procedures / Treatments   Labs (all labs ordered are listed, but only abnormal results are displayed) Labs Reviewed - No data to display  EKG None  Radiology No results found.  Procedures Procedures    Medications Ordered in ED Medications  diphenhydrAMINE (BENADRYL) injection 25 mg (25 mg Intravenous Given 01/17/23 2319)  famotidine (PEPCID) IVPB 20 mg premix (0 mg Intravenous Stopped 01/18/23 0017)  methylPREDNISolone sodium succinate (SOLU-MEDROL) 125 mg/2 mL injection 125 mg (125 mg Intravenous Given 01/17/23 2319)    ED Course/ Medical Decision Making/ A&P                                 Medical Decision Making Allergic reaction to bee sting  Amount and/or Complexity of Data Reviewed External Data Reviewed: notes.    Details: Previous notes reviewed   Risk Prescription drug management. Risk Details: Resting comfortably post medication hives have resolved.  Observed to ensure no rebound. Will discharge on steroids and pepcid as a secondary anti histamine.  Continue benadryl every 6 hours for 2 days.      Final Clinical Impression(s) / ED Diagnoses Final diagnoses:  None   The patient appears reasonably stabilized for admission considering the current resources, flow, and capabilities available in the ED at this time, and I doubt any other [facility] requiring further screening and/or treatment in the ED prior to admission.  Rx / DC Orders ED Discharge Orders     None         Daionna Crossland, MD 01/18/23 0230

## 2023-05-13 ENCOUNTER — Other Ambulatory Visit: Payer: Self-pay | Admitting: Cardiovascular Disease

## 2023-06-14 ENCOUNTER — Other Ambulatory Visit: Payer: Self-pay | Admitting: Cardiovascular Disease

## 2023-06-28 ENCOUNTER — Other Ambulatory Visit: Payer: Self-pay | Admitting: Cardiovascular Disease

## 2023-07-09 ENCOUNTER — Other Ambulatory Visit: Payer: Self-pay | Admitting: Cardiovascular Disease

## 2023-08-12 ENCOUNTER — Telehealth: Payer: Self-pay | Admitting: Cardiovascular Disease

## 2023-08-12 ENCOUNTER — Ambulatory Visit: Payer: 59 | Admitting: Cardiovascular Disease

## 2023-08-12 ENCOUNTER — Other Ambulatory Visit: Payer: Self-pay | Admitting: Cardiovascular Disease

## 2023-08-12 MED ORDER — AMLODIPINE BESYLATE 5 MG PO TABS: ORAL_TABLET | ORAL | 0 refills | Status: DC

## 2023-08-12 NOTE — Telephone Encounter (Signed)
*  STAT* If patient is at the pharmacy, call can be transferred to refill team.   1. Which medications need to be refilled? (please list name of each medication and dose if known) amLODipine (NORVASC) 5 MG tablet    2. Would you like to learn more about the convenience, safety, & potential cost savings by using the [facility]? No   3. Are you open to using the [facility] (Type [facility]. ) No   4. Which pharmacy/location (including street and city if local pharmacy) is medication to be sent to? [facility] [id number] [place], [place] - [address]    5. Do they need a 30 day or 90 day supply? 90 day   Pt has scheduled appt on 2/26 and is out of medication

## 2023-08-13 ENCOUNTER — Encounter: Payer: Self-pay | Admitting: *Deleted

## 2023-08-13 NOTE — Progress Notes (Deleted)
 Cardiology Office Note    Patient Name: Kevin Navarro Date of Encounter: 08/13/2023  Primary Care Provider:  [facility] Primary Cardiologist:  Verne Carrow, MD Primary Electrophysiologist: None   Past Medical History    Past Medical History:  Diagnosis Date   Chest pain of uncertain etiology 10/08/2016   Chest pain with moderate risk for cardiac etiology 10/09/2016   H/O cardiac catheterization    a. done 10/08/16 showing no evidence of CAD, myocardial bridging noted in the mid to distal LAD, normal LV function EF >65%,    HTN (hypertension)    Hyperglycemia    Hypokalemia    Meniscus, medial, bucket handle tear, old 06/22/2016   Myocardial bridge    Precordial pain    Status post arthroscopic surgery of left knee 07/07/2016    History of Present Illness  Kevin Navarro is a 48 y.o. male with a PMH of CAD s/p STEMI with LHC showing no obstructive CAD with myocardial bridging noted in the mid to distal LAD, HTN who presents today for 1 year follow-up.  [name] was seen initially in 2018 in the ED for complaint of chest pain.  He was found to have elevated BP at 190/110 prior to ED visit.  EKG was completed showing ST elevation in lead V2 and LHC was performed that showed no evidence of obstructive CAD with myocardial bridging noted in the mid to distal LAD.  2D echo was also completed showing EF of 55 to 60% with no RWMA and normal diastolic parameters with trace MR and TR.  He was last seen by Dr. Clifton James on 12//23 for annual follow-up and management of HTN.  During his visit he was doing well with blood pressure well-controlled and no changes made to current therapy.    During today's visit the patient reports*** .  Patient denies chest pain, palpitations, dyspnea, PND, orthopnea, nausea, vomiting, dizziness, syncope, edema, weight gain, or early satiety.  ***Notes: -Last ischemic evaluation: -Last echo: -Interim ED  visits: Review of Systems  Please see the history of present illness.    All other systems reviewed and are otherwise negative except as noted above.  Physical Exam    Wt Readings from Last 3 Encounters:  11/07/22 174 lb (78.9 kg)  05/21/22 167 lb 6.4 oz (75.9 kg)  11/28/21 179 lb 9.6 oz (81.5 kg)   [id number] were no vitals filed for this visit.,There is no height or weight on file to calculate BMI. GEN: Well nourished, well developed in no acute distress Neck: No JVD; No carotid bruits Pulmonary: Clear to auscultation without rales, wheezing or rhonchi  Cardiovascular: Normal rate. Regular rhythm. Normal S1. Normal S2.   Murmurs: There is no murmur.  ABDOMEN: Soft, non-tender, non-distended EXTREMITIES:  No edema; No deformity   EKG/LABS/ Recent Cardiac Studies   ECG personally reviewed by me today - ***  Risk Assessment/Calculations:   {Does this patient have ATRIAL FIBRILLATION?[phone]}      Lab Results  Component Value Date   WBC 10.6 (H) 11/07/2022   HGB 13.6 11/07/2022   HCT 40.2 11/07/2022   MCV 88.0 11/07/2022   PLT 251 11/07/2022   Lab Results  Component Value Date   CREATININE 0.86 11/07/2022   BUN 11 11/07/2022   NA 133 (L) 11/07/2022   K 3.5 11/07/2022   CL 101 11/07/2022   CO2 22 11/07/2022   Lab Results  Component Value Date   CHOL 209 (H) 08/09/2021   HDL 53 08/09/2021  LDLCALC 135 (H) 08/09/2021   TRIG 116 08/09/2021   CHOLHDL 3.9 08/09/2021    Lab Results  Component Value Date   HGBA1C 5.4 10/09/2016   Assessment & Plan    1.  Nonobstructive CAD: -LHC performed in 2018 showing minimal obstruction and LAD bridging  2.  Primary hypertension  3.***  4.***      Disposition: Follow-up with Verne Carrow, MD or APP in *** months {Are you ordering a CV Procedure (e.g. stress test, cath, DCCV, TEE, etc)?   Press F2        :[id number]}   Signed, Napoleon Form, Leodis Rains, NP 08/13/2023, 8:51 AM [facility]

## 2023-08-14 ENCOUNTER — Ambulatory Visit: Payer: 59 | Attending: Nurse Practitioner | Admitting: Nurse Practitioner

## 2023-08-14 ENCOUNTER — Ambulatory Visit: Payer: 59 | Admitting: Nurse Practitioner

## 2023-08-14 ENCOUNTER — Encounter: Payer: Self-pay | Admitting: Nurse Practitioner

## 2023-08-14 VITALS — BP 118/78 | HR 64 | Ht 66.0 in | Wt 169.0 lb

## 2023-08-14 DIAGNOSIS — M79604 Pain in right leg: Secondary | ICD-10-CM

## 2023-08-14 DIAGNOSIS — I10 Essential (primary) hypertension: Secondary | ICD-10-CM | POA: Diagnosis not present

## 2023-08-14 DIAGNOSIS — E785 Hyperlipidemia, unspecified: Secondary | ICD-10-CM

## 2023-08-14 DIAGNOSIS — M79605 Pain in left leg: Secondary | ICD-10-CM

## 2023-08-14 DIAGNOSIS — I2510 Atherosclerotic heart disease of native coronary artery without angina pectoris: Secondary | ICD-10-CM

## 2023-08-14 DIAGNOSIS — Q245 Malformation of coronary vessels: Secondary | ICD-10-CM

## 2023-08-14 MED ORDER — METOPROLOL SUCCINATE ER 25 MG PO TB24: 25.0000 mg | ORAL_TABLET | Freq: Every day | ORAL | 3 refills | Status: DC

## 2023-08-14 MED ORDER — AMLODIPINE BESYLATE 5 MG PO TABS: ORAL_TABLET | ORAL | 3 refills | Status: DC

## 2023-08-14 MED ORDER — AMLODIPINE BESYLATE 5 MG PO TABS: 5.0000 mg | ORAL_TABLET | Freq: Every day | ORAL | 3 refills | Status: AC

## 2023-08-14 MED ORDER — METOPROLOL SUCCINATE ER 25 MG PO TB24: 25.0000 mg | ORAL_TABLET | Freq: Every day | ORAL | 3 refills | Status: AC

## 2023-08-14 NOTE — Progress Notes (Signed)
 Cardiology Office Note    Patient Name: Kevin Navarro Date of Encounter: 08/14/2023  Primary Care Provider:  [facility] Primary Cardiologist:  Verne Carrow, MD Primary Electrophysiologist: None   Past Medical History    Past Medical History:  Diagnosis Date   Chest pain of uncertain etiology 10/08/2016   Chest pain with moderate risk for cardiac etiology 10/09/2016   H/O cardiac catheterization    a. done 10/08/16 showing no evidence of CAD, myocardial bridging noted in the mid to distal LAD, normal LV function EF >65%,    HTN (hypertension)    Hyperglycemia    Hypokalemia    Meniscus, medial, bucket handle tear, old 06/22/2016   Myocardial bridge    Precordial pain    Status post arthroscopic surgery of left knee 07/07/2016    History of Present Illness  Kevin Navarro is a 48 y.o. male with a PMH of CAD s/p STEMI with LHC showing no obstructive CAD with myocardial bridging noted in the mid to distal LAD, HTN who presents today for 1 year follow-up.  [name] was seen initially in 2018 in the ED for complaint of chest pain.  He was found to have elevated BP at 190/110 prior to ED visit.  EKG was completed showing ST elevation in lead V2 and LHC was performed that showed no evidence of obstructive CAD with myocardial bridging noted in the mid to distal LAD.  2D echo was also completed showing EF of 55 to 60% with no RWMA and normal diastolic parameters with trace MR and TR.  He was last seen by Dr. Clifton James on 05/21/22 for annual follow-up and management of HTN.  During his visit he was doing well with blood pressure well-controlled and no changes made to current therapy.  He was seen in the ED on 11/07/2022 for complaint of substernal chest pain.  He was found to have negative troponins and cardiac cause was ruled out and patient was discharged with prescription for Prilosec.  [name] presents today for annual follow-up.   He was noted to have elevated BP initially at 142/86 and was improved at 118/78 on recheck.  He reports having been off his Amlodipine medication for about three to four days, which may have contributed to the elevated blood pressure. The patient also mentions a recent bout of flu and current allergy symptoms, which he believes may be affecting his overall well-being. He reports feeling weak and not at his best.  The patient also mentions occasional leg pain and swelling, particularly when traveling or consuming alcohol and seafood. He has noticed this swelling since childhood and it seems to improve with regular exercise. The patient also mentions that his cholesterol was slightly elevated at the last check and he has been trying to improve his diet by consuming more eggs and avocados. However, he admits to having some "cheat days" and a fondness for sweets.  Patient denies chest pain, palpitations, dyspnea, PND, orthopnea, nausea, vomiting, dizziness, syncope, edema, weight gain, or early satiety.   Review of Systems  Please see the history of present illness.    All other systems reviewed and are otherwise negative except as noted above.  Physical Exam    Wt Readings from Last 3 Encounters:  11/07/22 174 lb (78.9 kg)  05/21/22 167 lb 6.4 oz (75.9 kg)  11/28/21 179 lb 9.6 oz (81.5 kg)   WU:[id number] were no vitals filed for this visit.,There is no height or weight on file to calculate BMI. GEN:  Well nourished, well developed in no acute distress Neck: No JVD; No carotid bruits Pulmonary: Clear to auscultation without rales, wheezing or rhonchi  Cardiovascular: Normal rate. Regular rhythm. Normal S1. Normal S2.   Murmurs: There is no murmur.  ABDOMEN: Soft, non-tender, non-distended EXTREMITIES:  No edema; No deformity   EKG/LABS/ Recent Cardiac Studies   ECG personally reviewed by me today -none completed today  Risk Assessment/Calculations:          Lab Results  Component Value Date    WBC 10.6 (H) 11/07/2022   HGB 13.6 11/07/2022   HCT 40.2 11/07/2022   MCV 88.0 11/07/2022   PLT 251 11/07/2022   Lab Results  Component Value Date   CREATININE 0.86 11/07/2022   BUN 11 11/07/2022   NA 133 (L) 11/07/2022   K 3.5 11/07/2022   CL 101 11/07/2022   CO2 22 11/07/2022   Lab Results  Component Value Date   CHOL 209 (H) 08/09/2021   HDL 53 08/09/2021   LDLCALC 135 (H) 08/09/2021   TRIG 116 08/09/2021   CHOLHDL 3.9 08/09/2021    Lab Results  Component Value Date   HGBA1C 5.4 10/09/2016   Assessment & Plan    1.  Nonobstructive CAD: -LHC performed in 2018 showing minimal obstruction and LAD bridging -Today patient reports no chest pain or cardiac complaints since previous follow-up. -Toprol-XL 25 mg daily -Patient advised to continue low-sodium heart healthy diet and maintain physical activity of at least 150 minutes/week.  2.  Primary hypertension: Elevated blood pressure at the visit, possibly due to recent non-compliance with Amlodipine. Patient reports feeling unwell when not taking Amlodipine. -Resume Amlodipine 5 mg and Metoprolol 25mg  daily. -Check blood pressure at home for two weeks. -If blood pressure remains above 130/80, consider adjusting medication.  3.  Hyperlipidemia: -Patient's last LDL cholesterol was elevated at 135 -We will check fasting LFTs and lipids -We will plan to add statin therapy if cholesterol is abnormal.  4.Leg Pain and Swelling Patient reports leg pain and swelling, particularly when traveling or sitting for extended periods. Patient also reports varicose veins. -Advise patient to monitor for increased swelling, which could be a side effect of Amlodipine. -Consider alternative blood pressure medication if swelling becomes more significant. -Patient is currently followed by vein specialist.  Disposition: Follow-up with Verne Carrow, MD or APP in 12 months    Signed, Napoleon Form, Leodis Rains, NP 08/14/2023, 10:58  AM [facility]

## 2023-08-14 NOTE — Patient Instructions (Addendum)
 Medication Instructions:  REFILLED Metoprolol  DECREASE Norvasc to 5 mg take one tablet daily   Check blood pressure for 2 weeks and please let us know your readings.   *If you need a refill on your cardiac medications before your next appointment, please call your pharmacy*   Lab Work: LFT  LIPID PANEL  If you have labs (blood work) drawn today and your tests are completely normal, you will receive your results only by: MyChart Message (if you have MyChart) OR A paper copy in the mail If you have any lab test that is abnormal or we need to change your treatment, we will call you to review the results.   Follow-Up: At [facility], you and your health needs are our priority.  As part of our continuing mission to provide you with exceptional heart care, we have created designated Provider Care Teams.  These Care Teams include your primary Cardiologist (physician) and Advanced Practice Providers (APPs -  Physician Assistants and Nurse Practitioners) who all work together to provide you with the care you need, when you need it.   Your next appointment:   1 year(s)  Provider:   Robin Searing, NP or Dr. Clifton James     Other Instructions  Blood Pressure Record Sheet To take your blood pressure, you will need a blood pressure machine. You can buy a blood pressure machine (blood pressure monitor) at your clinic, drug store, or online. When choosing one, consider: An automatic monitor that has an arm cuff. A cuff that wraps snugly around your upper arm. You should be able to fit only one finger between your arm and the cuff. A device that stores blood pressure reading results. Do not choose a monitor that measures your blood pressure from your wrist or finger. Follow your health care provider's instructions for how to take your blood pressure. To use this form: Take your blood pressure medications every day These measurements should be taken when you have been at rest for at least  10-15 min Take at least 2 readings with each blood pressure check. This makes sure the results are correct. Wait 1-2 minutes between measurements. Write down the results in the spaces on this form. Keep in mind it should always be recorded systolic over diastolic. Both numbers are important.  Repeat this every day for 2-3 weeks, or as told by your health care provider.  Make a follow-up appointment with your health care provider to discuss the results.  Blood Pressure Log Date Medications taken? (Y/N) Blood Pressure Time of Day                                                                                                          Heart-Healthy Eating Plan Eating a healthy diet is important for the health of your heart. A heart-healthy eating plan includes: Eating less unhealthy fats. Eating more healthy fats. Eating less salt in your food. Salt is also called sodium. Making other changes in your diet. Talk with your doctor or a diet specialist (dietitian) to create an  eating plan that is right for you. What is my plan? Your doctor may recommend an eating plan that includes: Total fat: ______% or less of total calories a day. Saturated fat: ______% or less of total calories a day. Cholesterol: less than _________mg a day. Sodium: less than _________mg a day. What are tips for following this plan? Cooking Avoid frying your food. Try to bake, boil, grill, or broil it instead. You can also reduce fat by: Removing the skin from poultry. Removing all visible fats from meats. Steaming vegetables in water or broth. Meal planning  At meals, divide your plate into four equal parts: Fill one-half of your plate with vegetables and green salads. Fill one-fourth of your plate with whole grains. Fill one-fourth of your plate with lean protein foods. Eat 2-4 cups of vegetables per day. One cup of vegetables is: 1 cup (91 g) broccoli or cauliflower florets. 2 medium  carrots. 1 large bell pepper. 1 large sweet potato. 1 large tomato. 1 medium white potato. 2 cups (150 g) raw leafy greens. Eat 1-2 cups of fruit per day. One cup of fruit is: 1 small apple 1 large banana 1 cup (237 g) mixed fruit, 1 large orange,  cup (82 g) dried fruit, 1 cup (240 mL) 100% fruit juice. Eat more foods that have soluble fiber. These are apples, broccoli, carrots, beans, peas, and barley. Try to get 20-30 g of fiber per day. Eat 4-5 servings of nuts, legumes, and seeds per week: 1 serving of dried beans or legumes equals  cup (90 g) cooked. 1 serving of nuts is  oz (12 almonds, 24 pistachios, or 7 walnut halves). 1 serving of seeds equals  oz (8 g). General information Eat more home-cooked food. Eat less restaurant, buffet, and fast food. Limit or avoid alcohol. Limit foods that are high in starch and sugar. Avoid fried foods. Lose weight if you are overweight. Keep track of how much salt (sodium) you eat. This is important if you have high blood pressure. Ask your doctor to tell you more about this. Try to add vegetarian meals each week. Fats Choose healthy fats. These include olive oil and canola oil, flaxseeds, walnuts, almonds, and seeds. Eat more omega-3 fats. These include salmon, mackerel, sardines, tuna, flaxseed oil, and ground flaxseeds. Try to eat fish at least 2 times each week. Check food labels. Avoid foods with trans fats or high amounts of saturated fat. Limit saturated fats. These are often found in animal products, such as meats, butter, and cream. These are also found in plant foods, such as palm oil, palm kernel oil, and coconut oil. Avoid foods with partially hydrogenated oils in them. These have trans fats. Examples are stick margarine, some tub margarines, cookies, crackers, and other baked goods. What foods should I eat? Fruits All fresh, canned (in natural juice), or frozen fruits. Vegetables Fresh or frozen vegetables (raw,  steamed, roasted, or grilled). Green salads. Grains Most grains. Choose whole wheat and whole grains most of the time. Rice and pasta, including brown rice and pastas made with whole wheat. Meats and other proteins Lean, well-trimmed beef, veal, pork, and lamb. Chicken and [place] without skin. All fish and shellfish. Wild duck, rabbit, pheasant, and venison. Egg whites or low-cholesterol egg substitutes. Dried beans, peas, lentils, and tofu. Seeds and most nuts. Dairy Low-fat or nonfat cheeses, including ricotta and mozzarella. Skim or 1% milk that is liquid, powdered, or evaporated. Buttermilk that is made with low-fat milk. Nonfat or low-fat yogurt.  Fats and oils Non-hydrogenated (trans-free) margarines. Vegetable oils, including soybean, sesame, sunflower, olive, peanut, safflower, corn, canola, and cottonseed. Salad dressings or mayonnaise made with a vegetable oil. Beverages Mineral water. Coffee and tea. Diet carbonated beverages. Sweets and desserts Sherbet, gelatin, and fruit ice. Small amounts of dark chocolate. Limit all sweets and desserts. Seasonings and condiments All seasonings and condiments. The items listed above may not be a complete list of foods and drinks you can eat. Contact a dietitian for more options. What foods should I avoid? Fruits Canned fruit in heavy syrup. Fruit in cream or butter sauce. Fried fruit. Limit coconut. Vegetables Vegetables cooked in cheese, cream, or butter sauce. Fried vegetables. Grains Breads that are made with saturated or trans fats, oils, or whole milk. Croissants. Sweet rolls. Donuts. High-fat crackers, such as cheese crackers. Meats and other proteins Fatty meats, such as hot dogs, ribs, sausage, bacon, rib-eye roast or steak. High-fat deli meats, such as salami and bologna. Caviar. Domestic duck and goose. Organ meats, such as liver. Dairy Cream, sour cream, cream cheese, and creamed cottage cheese. Whole-milk cheeses. Whole or 2% milk  that is liquid, evaporated, or condensed. Whole buttermilk. Cream sauce or high-fat cheese sauce. Yogurt that is made from whole milk. Fats and oils Meat fat, or shortening. Cocoa butter, hydrogenated oils, palm oil, coconut oil, palm kernel oil. Solid fats and shortenings, including bacon fat, salt pork, lard, and butter. Nondairy cream substitutes. Salad dressings with cheese or sour cream. Beverages Regular sodas and juice drinks with added sugar. Sweets and desserts Frosting. Pudding. Cookies. Cakes. Pies. Milk chocolate or white chocolate. Buttered syrups. Full-fat ice cream or ice cream drinks. The items listed above may not be a complete list of foods and drinks to avoid. Contact a dietitian for more information. Summary Heart-healthy meal planning includes eating less unhealthy fats, eating more healthy fats, and making other changes in your diet. Eat a balanced diet. This includes fruits and vegetables, low-fat or nonfat dairy, lean protein, nuts and legumes, whole grains, and heart-healthy oils and fats. This information is not intended to replace advice given to you by your health care provider. Make sure you discuss any questions you have with your health care provider. Document Revised: 07/10/2021 Document Reviewed: 07/10/2021 Elsevier Patient Education  2024 [employer]    1st Floor: - Lobby - Registration  - Pharmacy  - Lab - Cafe  2nd Floor: - PV Lab - Diagnostic Testing (echo, CT, nuclear med)  3rd Floor: - Vacant  4th Floor: - TCTS (cardiothoracic surgery) - AFib Clinic - Structural Heart Clinic - Vascular Surgery  - Vascular Ultrasound  5th Floor: - [facility] Cardiology (general and EP) - Clinical Pharmacy for coumadin, hypertension, lipid, weight-loss medications, and med management appointments    Valet parking services will be available as well.

## 2023-08-14 NOTE — Addendum Note (Signed)
 Addended by: Lamar Benes on: 08/14/2023 05:17 PM   Modules accepted: Orders

## 2023-08-15 ENCOUNTER — Other Ambulatory Visit: Payer: Self-pay | Admitting: Cardiovascular Disease

## 2023-08-15 LAB — HEPATIC FUNCTION PANEL
ALT: 29 [IU]/L (ref 0–44)
AST: 20 [IU]/L (ref 0–40)
Albumin: 4.4 g/dL (ref 4.1–5.1)
Alkaline Phosphatase: 82 [IU]/L (ref 44–121)
Bilirubin Total: 0.4 mg/dL (ref 0.0–1.2)
Bilirubin, Direct: 0.12 mg/dL (ref 0.00–0.40)
Total Protein: 7.2 g/dL (ref 6.0–8.5)

## 2023-08-15 LAB — LIPID PANEL
Chol/HDL Ratio: 4.5 {ratio} (ref 0.0–5.0)
Cholesterol, Total: 199 mg/dL (ref 100–199)
HDL: 44 mg/dL (ref >39)
LDL Chol Calc (NIH): 127 mg/dL — ABNORMAL HIGH (ref 0–99)
Triglycerides: 157 mg/dL — ABNORMAL HIGH (ref 0–149)
VLDL Cholesterol Cal: 28 mg/dL (ref 5–40)

## 2023-08-16 ENCOUNTER — Other Ambulatory Visit: Payer: Self-pay | Admitting: *Deleted

## 2023-08-16 DIAGNOSIS — E785 Hyperlipidemia, unspecified: Secondary | ICD-10-CM

## 2023-08-16 DIAGNOSIS — I2510 Atherosclerotic heart disease of native coronary artery without angina pectoris: Secondary | ICD-10-CM

## 2023-08-16 MED ORDER — ROSUVASTATIN CALCIUM 5 MG PO TABS: 5.0000 mg | ORAL_TABLET | Freq: Every day | ORAL | 3 refills | Status: AC
